# Patient Record
Sex: Female | Born: 1955 | Race: White | Hispanic: No | Marital: Married | Smoking: Former smoker
Health system: Southern US, Community
[De-identification: ages and names within clinical notes are randomized; demographics above are authoritative.]

## PROBLEM LIST (undated history)

## (undated) DIAGNOSIS — G8929 Other chronic pain: Secondary | ICD-10-CM

## (undated) DIAGNOSIS — R002 Palpitations: Secondary | ICD-10-CM

## (undated) DIAGNOSIS — K2270 Barrett's esophagus without dysplasia: Secondary | ICD-10-CM

## (undated) DIAGNOSIS — A498 Other bacterial infections of unspecified site: Secondary | ICD-10-CM

## (undated) DIAGNOSIS — C801 Malignant (primary) neoplasm, unspecified: Secondary | ICD-10-CM

## (undated) DIAGNOSIS — R51 Headache: Secondary | ICD-10-CM

## (undated) DIAGNOSIS — M1990 Unspecified osteoarthritis, unspecified site: Secondary | ICD-10-CM

## (undated) DIAGNOSIS — K529 Noninfective gastroenteritis and colitis, unspecified: Secondary | ICD-10-CM

## (undated) DIAGNOSIS — K219 Gastro-esophageal reflux disease without esophagitis: Secondary | ICD-10-CM

## (undated) DIAGNOSIS — I10 Essential (primary) hypertension: Secondary | ICD-10-CM

## (undated) DIAGNOSIS — T7840XA Allergy, unspecified, initial encounter: Secondary | ICD-10-CM

## (undated) DIAGNOSIS — S22000A Wedge compression fracture of unspecified thoracic vertebra, initial encounter for closed fracture: Secondary | ICD-10-CM

## (undated) DIAGNOSIS — I701 Atherosclerosis of renal artery: Secondary | ICD-10-CM

## (undated) DIAGNOSIS — M797 Fibromyalgia: Secondary | ICD-10-CM

## (undated) DIAGNOSIS — Z8619 Personal history of other infectious and parasitic diseases: Secondary | ICD-10-CM

## (undated) DIAGNOSIS — R519 Headache, unspecified: Secondary | ICD-10-CM

## (undated) DIAGNOSIS — Z86010 Personal history of colonic polyps: Secondary | ICD-10-CM

## (undated) DIAGNOSIS — M069 Rheumatoid arthritis, unspecified: Secondary | ICD-10-CM

## (undated) DIAGNOSIS — K222 Esophageal obstruction: Secondary | ICD-10-CM

## (undated) DIAGNOSIS — Z8719 Personal history of other diseases of the digestive system: Secondary | ICD-10-CM

## (undated) DIAGNOSIS — E559 Vitamin D deficiency, unspecified: Secondary | ICD-10-CM

## (undated) DIAGNOSIS — M810 Age-related osteoporosis without current pathological fracture: Secondary | ICD-10-CM

## (undated) HISTORY — DX: Atherosclerosis of renal artery: I70.1

## (undated) HISTORY — DX: Allergy, unspecified, initial encounter: T78.40XA

## (undated) HISTORY — DX: Age-related osteoporosis without current pathological fracture: M81.0

## (undated) HISTORY — PX: ESOPHAGOGASTRODUODENOSCOPY: SHX1529

## (undated) HISTORY — DX: Other bacterial infections of unspecified site: A49.8

## (undated) HISTORY — DX: Rheumatoid arthritis, unspecified: M06.9

## (undated) HISTORY — DX: Gastro-esophageal reflux disease without esophagitis: K21.9

## (undated) HISTORY — DX: Noninfective gastroenteritis and colitis, unspecified: K52.9

## (undated) HISTORY — DX: Vitamin D deficiency, unspecified: E55.9

## (undated) HISTORY — DX: Esophageal obstruction: K22.2

## (undated) HISTORY — PX: BRACHIOCEPHALIC VEIN ANGIOPLASTY / STENTING: SHX1263

## (undated) HISTORY — PX: COLONOSCOPY: SHX174

## (undated) HISTORY — DX: Headache: R51

## (undated) HISTORY — DX: Headache, unspecified: R51.9

## (undated) HISTORY — DX: Wedge compression fracture of unspecified thoracic vertebra, initial encounter for closed fracture: S22.000A

## (undated) HISTORY — DX: Unspecified osteoarthritis, unspecified site: M19.90

## (undated) HISTORY — DX: Other chronic pain: G89.29

---
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
## 1987-01-16 HISTORY — PX: ABDOMINAL HYSTERECTOMY: SHX81

## 1995-01-16 HISTORY — PX: ROTATOR CUFF REPAIR: SHX139

## 2011-01-16 HISTORY — PX: CARDIAC CATHETERIZATION: SHX172

## 2012-02-08 ENCOUNTER — Encounter (HOSPITAL_COMMUNITY): Payer: Self-pay | Admitting: Pharmacy Technician

## 2012-02-08 ENCOUNTER — Other Ambulatory Visit: Payer: Self-pay | Admitting: Neurosurgery

## 2012-02-11 ENCOUNTER — Encounter (HOSPITAL_COMMUNITY): Payer: Self-pay

## 2012-02-11 ENCOUNTER — Encounter (HOSPITAL_COMMUNITY): Payer: Self-pay | Admitting: Vascular Surgery

## 2012-02-11 ENCOUNTER — Encounter (HOSPITAL_COMMUNITY)
Admission: RE | Admit: 2012-02-11 | Discharge: 2012-02-11 | Disposition: A | Discharge status: Home / Self Care | Payer: 59 | Source: Ambulatory Visit | Attending: Neurosurgery | Admitting: Neurosurgery

## 2012-02-11 HISTORY — DX: Barrett's esophagus without dysplasia: K22.70

## 2012-02-11 HISTORY — DX: Palpitations: R00.2

## 2012-02-11 HISTORY — DX: Fibromyalgia: M79.7

## 2012-02-11 HISTORY — DX: Personal history of other diseases of the digestive system: Z87.19

## 2012-02-11 HISTORY — DX: Essential (primary) hypertension: I10

## 2012-02-11 LAB — CBC
Hemoglobin: 13.9 g/dL (ref 12.0–15.0)
MCH: 29.9 pg (ref 26.0–34.0)
MCHC: 34.3 g/dL (ref 30.0–36.0)
Platelets: 196 10*3/uL (ref 150–400)
RDW: 13.3 % (ref 11.5–15.5)

## 2012-02-11 LAB — BASIC METABOLIC PANEL
BUN: 11 mg/dL (ref 6–23)
Calcium: 9.7 mg/dL (ref 8.4–10.5)
Creatinine, Ser: 0.88 mg/dL (ref 0.50–1.10)
GFR calc Af Amer: 84 mL/min — ABNORMAL LOW (ref >90)
GFR calc non Af Amer: 72 mL/min — ABNORMAL LOW (ref >90)
Potassium: 4.1 mEq/L (ref 3.5–5.1)

## 2012-02-11 LAB — SURGICAL PCR SCREEN
MRSA, PCR: NEGATIVE
Staphylococcus aureus: POSITIVE — AB

## 2012-02-11 MED ORDER — VANCOMYCIN HCL IN DEXTROSE 1-5 GM/200ML-% IV SOLN
1000.0000 mg | Freq: Once | INTRAVENOUS | Status: AC
  Administered 2012-02-12: 1000 mg via INTRAVENOUS
  Filled 2012-02-11: qty 200

## 2012-02-11 NOTE — H&P (Signed)
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 She fell and tripped over a cat on 01/13/2012 and she has had nausea and severe radiating pain around her ribcage.  She says the nausea was severe for the first ten days. She currently describes back and upper abdominal pain at 9/10 and up to 10/10 at times.  She has been taking Flexeril 10 mg. two po q.h.s., Tramadol 50 mg. up to two three times daily, and Cymbalta 30 mg. daily for fibromyalgia without any relief of her significant back pain.    REVIEW OF SYSTEMS:   A detailed Review of Systems sheet was reviewed with the patient.  Pertinent positives include under cardiovascular - she notes leg pain with walking, under respiratory - she notes shortness of breath, under gastrointestinal - she notes indigestion or pain with eating, nausea, and abdominal pain, under musculoskeletal - she notes broken bones and bak pain.  All other systems are negative; this includes Constitutional symptoms, Eyes, Ears, nose, mouth, throat, Endocrine, Genitourinary, Integumentary & Breast, Neurologic, Psychiatric, Hematologic/Lymphatic, Allergic/Immunologic.    PAST MEDICAL HISTORY:      Current Medical Conditions:    Hypertension, GERD with Barrett's esophagus, cervical cancer, and fibromyalgia.      Prior Operations and Hospitalizations:   Significant for hysterectomy in 1989, rotator cuff surgery in 1998.     Medications and Allergies:  Her current medications are Toprol XL 100 mg. b.i.d., Hydrochlorothiazide 25 mg. qd, Dexilant 60 mg. qd, Cymbalta 30 mg. qd, Topamax 50 mg. two q.h.s., Zolpidem 5-10 mg. prn, Cyclobenzaprine 10 mg. q.h.s., Lovaza, CoQ10 qd, Aspirin 325 mg. qd, and Tramadol 50 mg. one to two po q4-6h.  ALLERGIES ARE TO CODEINE AND PENICILLIN.      Height and Weight:     She is  5'3" tall, 133 lbs.  She has a BMI of 23.5.    FAMILY HISTORY:    Her mother is 74 in very good health. Her father is deceased.  There is a family history of Parkinson's disease.    SOCIAL HISTORY:    She denies tobacco, alcohol or drug use.    DIAGNOSTIC STUDIES:   I do not have her recent bone density test, but she states that she had this done and that it was consistent with osteoporosis and osteopenia.    She had an MRI of her thoracic spine which was obtained through [facility] which shows acute to subacute compression fracture of the T9 vertebral body with 60% height loss and less than, I believe it is a typo, but it says less than 5 cm. of retropulsion along the inferior aspect per the report, but in fact this is 5 mm.  There is no evidence of acute angulation or canal compromise or cord compression.    Radiographs were obtained in the office today which show approximately 70% vertebral height loss at the T9 vertebra.  No significant kyphotic angulation or evidence of fracture at other levels.    PHYSICAL EXAMINATION:      General Appearance:   On examination today, Breanna Barr is a pleasant and cooperative woman in no acute distress.      Blood Pressure, Pulse:     Her blood pressure is 130/82.  Heart rate is 74 and regular.  Respiratory rate is  18.      HEENT - normocephalic, atraumatic.  The pupils are equal, round and reactive to light.  The extraocular muscles are intact.  Sclerae - white.  Conjunctiva - pink.  Oropharynx benign.  Uvula midline.     Neck - there are no masses, meningismus, deformities, tracheal deviation, jugular vein distention or carotid bruits.  There is normal cervical range of motion.  Spurlings' test is negative without reproducible radicular pain turning the patient's head to either side.  Lhermitte's sign is not present with axial compression.      Respiratory - there is normal respiratory effort with good intercostal function.  Lungs are clear  to auscultation.  There are no rales, rhonchi or wheezes.      Cardiovascular - the heart has regular rate and rhythm to auscultation.  No murmurs are appreciated.  There is no extremity edema, cyanosis or clubbing.  There are palpable pedal pulses.      Abdomen - soft, nontender, no hepatosplenomegaly appreciated or masses.  There are active bowel sounds.  No guarding or rebound.      Musculoskeletal Examination - she has pain to palpation just below the bra line at approximately the level of T9 and also some paravertebral discomfort to palpation.  She has difficulty getting up from a seated position and has significant pain when she tries to move about the examining room.      NEUROLOGICAL EXAMINATION: The patient is oriented to time, person and place and has good recall of both recent and remote memory with normal attention span and concentration.  The patient speaks with clear and fluent speech and exhibits normal language function and appropriate fund of knowledge.      Cranial Nerve Examination - pupils are equal, round and reactive to light.  Extraocular movements are full.  Visual fields are full to confrontational testing.  Facial sensation and facial movement are symmetric and intact.  Hearing is intact to finger rub.  Palate is upgoing.  Shoulder shrug is symmetric.  Tongue protrudes in the midline.      Motor Examination - motor strength is 5/5 in the bilateral deltoids, biceps, triceps, handgrips, wrist extensors, interosseous.  In the lower extremities motor strength is 5/5 in hip flexion, extension, quadriceps, hamstrings, plantar flexion, dorsiflexion and extensor hallucis longus.      Sensory Examination - normal to light touch and pinprick sensation in the upper and lower extremities.  She does have some T9-T10 hypalgesia to pinprick which is mild and I believe is the basis for her radiating abdominal pain.      Deep Tendon Reflexes - 2 in the biceps, triceps, and brachioradialis, 2  in the knees, 2 in the ankles.  The great toes are downgoing to plantar stimulation.      Cerebellar Examination - normal coordination in upper and lower extremities and normal rapid alternating movements.  Romberg test is negative.    IMPRESSION AND RECOMMENDATIONS: Breanna Barr is a 57 year old woman with a severely painful T9 compression for which she has tried approximately a month of conservative management and feels it is not improving in terms of severity of her pain and limitations of her activities of daily living and ability to function.  Because of this, I have recommended we proceed with T9 kyphoplasty procedure.  She wishes to do so. I went over the specifics of the surgery and went over models, and answered her and her husband's questions.  They wish to proceed and we plan on  doing so in the near future. She knows about needing to be on bone replacement therapy for her osteoporosis.  She will get me the bone density report for my records.     [facility]    Danae Orleans. Venetia Maxon, M.D.  JDS:aft cc: Dr. Laney Potash   Dr. Concepcion Living, [address], [place], [place]  [address]

## 2012-02-11 NOTE — Consult Note (Signed)
Anesthesia Chart Review:  Patient is a 57 year old female scheduled for T9 kyphoplasty on 02/12/12 by Dr. Venetia Maxon.  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  She also reported possible TIA symptoms with hospitalization in 2011 with reported negative work-up.  She has seen cardiologist Dr. Teofilo Pod at [facility] in the past.  Records from his office are pending, but some records were received from [facility] (see below).  Cardiac cath on 06/07/11 [facility]) showed mild non-obstructive CAD (pLAD 20-30%, ostial LCx 20-30%, mCx 20-30%), normal LV wall motion and function, EF 60-65%, normal PA pressure, mild PVD of the right iliac and right common femoral artery.  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 EF 60-65%.  Mild diastolic dysfunction.  Mild MR, trace TR.  EKG and CXR re-requested from [facility], but are still pending.  Notes indicate that an EKG from 01/2011 showed NSR, left BBB, possible LAE.  If no results within the past year, then she will need them done on the day of surgery.  Pre-operative labs noted.  No additional records were received by 1700 today, so any additional records will have to be reviewed by her assigned anesthesiologist tomorrow.  She had only mild CAD by cath last year, so would anticipate she could proceed as planned.  Shonna Chock, PA-C 02/11/12 1700

## 2012-02-11 NOTE — Progress Notes (Signed)
Cardiologist: Dr. Teofilo Pod at [facility] [phone]. Will request ekg,stress test,echo,cath report,cxr and office notes. Pt. States these test were done summer 2013 due to sob and chest pain. States test were negative and symptoms were from hiatal hernia and acid reflux.  Pt. Reported [id number] she had TIA., occipital small hemorrhage, and started aspirin.Never went back to this doctor. Pt. States she doesn't remember his name and that doctor no longer practices in [place]. Pt. Reports 2 yrs. Ago,she had dizziness at the beauty shop and fell back into the chair. Went to [facility] for increase blood pressure and TIA. States she followed up with Kalman Jewels[facility] [place]. Will request notes .

## 2012-02-11 NOTE — Pre-Procedure Instructions (Signed)
Breanna Barr  02/11/2012   Your procedure is scheduled on:  Tuesday, Jan. 28,2014  Report to [facility] at 11:30 AM.  Call this number if you have problems the morning of surgery: [phone]   Remember:   Do not eat food or drink liquids after midnight.   Take these medicines the morning of surgery with A SIP OF WATER: flexeril, cymbalta, metoprolol, topamax, deilant   Do not wear jewelry, make-up or nail polish.  Do not wear lotions, powders, or perfumes. You may wear deodorant.  Do not shave 48 hours prior to surgery. Men may shave face and neck.  Do not bring valuables to the hospital.  Contacts, dentures or bridgework may not be worn into surgery.  Leave suitcase in the car. After surgery it may be brought to your room.  For patients admitted to the hospital, checkout time is 11:00 AM the day of  discharge.   Patients discharged the day of surgery will not be allowed to drive  home.    Special Instructions: Shower using CHG 2 nights before surgery and the night before surgery.  If you shower the day of surgery use CHG.  Use special wash - you have one bottle of CHG for all showers.  You should use approximately 1/3 of the bottle for each shower.   Please read over the following fact sheets that you were given: Pain Booklet, Coughing and Deep Breathing and MRSA Information

## 2012-02-12 ENCOUNTER — Ambulatory Visit (HOSPITAL_COMMUNITY): Payer: 59 | Admitting: Vascular Surgery

## 2012-02-12 ENCOUNTER — Encounter (HOSPITAL_COMMUNITY): Payer: Self-pay | Admitting: Vascular Surgery

## 2012-02-12 ENCOUNTER — Observation Stay (HOSPITAL_COMMUNITY): Payer: 59

## 2012-02-12 ENCOUNTER — Encounter (HOSPITAL_COMMUNITY): Payer: Self-pay | Admitting: *Deleted

## 2012-02-12 ENCOUNTER — Observation Stay (HOSPITAL_COMMUNITY)
Admission: RE | Admit: 2012-02-12 | Discharge: 2012-02-12 | Disposition: A | Discharge status: Home / Self Care | Payer: 59 | Source: Ambulatory Visit | Attending: Neurosurgery | Admitting: Neurosurgery

## 2012-02-12 ENCOUNTER — Encounter (HOSPITAL_COMMUNITY)
Admission: RE | Disposition: A | Discharge status: Home / Self Care | Payer: Self-pay | Source: Ambulatory Visit | Attending: Neurosurgery

## 2012-02-12 DIAGNOSIS — M810 Age-related osteoporosis without current pathological fracture: Secondary | ICD-10-CM | POA: Insufficient documentation

## 2012-02-12 DIAGNOSIS — I10 Essential (primary) hypertension: Secondary | ICD-10-CM | POA: Insufficient documentation

## 2012-02-12 DIAGNOSIS — J449 Chronic obstructive pulmonary disease, unspecified: Secondary | ICD-10-CM | POA: Insufficient documentation

## 2012-02-12 DIAGNOSIS — IMO0001 Reserved for inherently not codable concepts without codable children: Secondary | ICD-10-CM | POA: Insufficient documentation

## 2012-02-12 DIAGNOSIS — K2270 Barrett's esophagus without dysplasia: Secondary | ICD-10-CM | POA: Insufficient documentation

## 2012-02-12 DIAGNOSIS — J4489 Other specified chronic obstructive pulmonary disease: Secondary | ICD-10-CM | POA: Insufficient documentation

## 2012-02-12 DIAGNOSIS — M8448XA Pathological fracture, other site, initial encounter for fracture: Principal | ICD-10-CM | POA: Insufficient documentation

## 2012-02-12 DIAGNOSIS — Z79899 Other long term (current) drug therapy: Secondary | ICD-10-CM | POA: Insufficient documentation

## 2012-02-12 DIAGNOSIS — Z01812 Encounter for preprocedural laboratory examination: Secondary | ICD-10-CM | POA: Insufficient documentation

## 2012-02-12 HISTORY — PX: KYPHOPLASTY: SHX5884

## 2012-02-12 SURGERY — KYPHOPLASTY
Anesthesia: General | Site: Back | Wound class: Clean

## 2012-02-12 MED ORDER — EPHEDRINE SULFATE 50 MG/ML IJ SOLN
INTRAMUSCULAR | Status: DC | PRN
  Administered 2012-02-12: 10 mg via INTRAVENOUS

## 2012-02-12 MED ORDER — SODIUM CHLORIDE 0.9 % IJ SOLN: 3.0000 mL | Freq: Two times a day (BID) | INTRAMUSCULAR | Status: DC

## 2012-02-12 MED ORDER — DEXAMETHASONE SODIUM PHOSPHATE 4 MG/ML IJ SOLN
INTRAMUSCULAR | Status: DC | PRN
  Administered 2012-02-12: 4 mg via INTRAVENOUS

## 2012-02-12 MED ORDER — PANTOPRAZOLE SODIUM 40 MG PO TBEC: 40.0000 mg | DELAYED_RELEASE_TABLET | Freq: Every day | ORAL | Status: DC

## 2012-02-12 MED ORDER — FLEET ENEMA 7-19 GM/118ML RE ENEM: 1.0000 | ENEMA | Freq: Once | RECTAL | Status: DC | PRN

## 2012-02-12 MED ORDER — ACETAMINOPHEN 325 MG PO TABS: 650.0000 mg | ORAL_TABLET | ORAL | Status: DC | PRN

## 2012-02-12 MED ORDER — ONDANSETRON HCL 4 MG/2ML IJ SOLN
4.0000 mg | INTRAMUSCULAR | Status: DC | PRN
  Administered 2012-02-12: 4 mg via INTRAVENOUS
  Filled 2012-02-12: qty 2

## 2012-02-12 MED ORDER — OMEGA-3-ACID ETHYL ESTERS 1 G PO CAPS
1.0000 g | ORAL_CAPSULE | Freq: Two times a day (BID) | ORAL | Status: DC
  Administered 2012-02-12: 1 g via ORAL
  Filled 2012-02-12: qty 1

## 2012-02-12 MED ORDER — LIDOCAINE HCL 1 % IJ SOLN
INTRAMUSCULAR | Status: DC | PRN
  Administered 2012-02-12: 60 mg via INTRADERMAL

## 2012-02-12 MED ORDER — DOCUSATE SODIUM 100 MG PO CAPS
100.0000 mg | ORAL_CAPSULE | Freq: Two times a day (BID) | ORAL | Status: DC
  Administered 2012-02-12: 100 mg via ORAL
  Filled 2012-02-12: qty 1

## 2012-02-12 MED ORDER — HYDROCODONE-ACETAMINOPHEN 5-325 MG PO TABS: 1.0000 | ORAL_TABLET | ORAL | Status: DC | PRN

## 2012-02-12 MED ORDER — DULOXETINE HCL 30 MG PO CPEP
30.0000 mg | ORAL_CAPSULE | Freq: Every day | ORAL | Status: DC
  Filled 2012-02-12 (×2): qty 1

## 2012-02-12 MED ORDER — CYCLOBENZAPRINE HCL 10 MG PO TABS: 20.0000 mg | ORAL_TABLET | Freq: Every day | ORAL | Status: DC

## 2012-02-12 MED ORDER — PROPOFOL 10 MG/ML IV BOLUS
INTRAVENOUS | Status: DC | PRN
  Administered 2012-02-12: 160 mg via INTRAVENOUS

## 2012-02-12 MED ORDER — SODIUM CHLORIDE 0.9 % IJ SOLN: 3.0000 mL | INTRAMUSCULAR | Status: DC | PRN

## 2012-02-12 MED ORDER — KCL IN DEXTROSE-NACL 20-5-0.45 MEQ/L-%-% IV SOLN
INTRAVENOUS | Status: DC
  Filled 2012-02-12: qty 1000

## 2012-02-12 MED ORDER — MENTHOL 3 MG MT LOZG: 1.0000 | LOZENGE | OROMUCOSAL | Status: DC | PRN

## 2012-02-12 MED ORDER — OXYCODONE-ACETAMINOPHEN 5-325 MG PO TABS
1.0000 | ORAL_TABLET | ORAL | Status: DC | PRN
  Administered 2012-02-12: 1 via ORAL
  Filled 2012-02-12: qty 1

## 2012-02-12 MED ORDER — ZOLPIDEM TARTRATE 5 MG PO TABS: 5.0000 mg | ORAL_TABLET | Freq: Every evening | ORAL | Status: DC | PRN

## 2012-02-12 MED ORDER — OXYCODONE-ACETAMINOPHEN 5-325 MG PO TABS: 1.0000 | ORAL_TABLET | ORAL | Status: DC | PRN

## 2012-02-12 MED ORDER — FENTANYL CITRATE 0.05 MG/ML IJ SOLN
INTRAMUSCULAR | Status: AC
  Filled 2012-02-12: qty 2

## 2012-02-12 MED ORDER — IOHEXOL 300 MG/ML  SOLN
INTRAMUSCULAR | Status: DC | PRN
  Administered 2012-02-12: 50 mL via INTRAVENOUS

## 2012-02-12 MED ORDER — SENNOSIDES-DOCUSATE SODIUM 8.6-50 MG PO TABS
1.0000 | ORAL_TABLET | Freq: Every evening | ORAL | Status: DC | PRN
  Administered 2012-02-12: 1 via ORAL
  Filled 2012-02-12: qty 1

## 2012-02-12 MED ORDER — BUPIVACAINE HCL (PF) 0.5 % IJ SOLN
INTRAMUSCULAR | Status: DC | PRN
  Administered 2012-02-12: 2 mL

## 2012-02-12 MED ORDER — MIDAZOLAM HCL 5 MG/5ML IJ SOLN
INTRAMUSCULAR | Status: DC | PRN
  Administered 2012-02-12: 2 mg via INTRAVENOUS

## 2012-02-12 MED ORDER — TRAMADOL HCL 50 MG PO TABS
50.0000 mg | ORAL_TABLET | Freq: Three times a day (TID) | ORAL | Status: DC
  Filled 2012-02-12: qty 1

## 2012-02-12 MED ORDER — BISACODYL 10 MG RE SUPP: 10.0000 mg | Freq: Every day | RECTAL | Status: DC | PRN

## 2012-02-12 MED ORDER — SODIUM CHLORIDE 0.9 % IV SOLN: 250.0000 mL | INTRAVENOUS | Status: DC

## 2012-02-12 MED ORDER — ONDANSETRON HCL 4 MG/2ML IJ SOLN
INTRAMUSCULAR | Status: DC | PRN
  Administered 2012-02-12: 4 mg via INTRAVENOUS

## 2012-02-12 MED ORDER — METOPROLOL SUCCINATE ER 100 MG PO TB24
100.0000 mg | ORAL_TABLET | Freq: Two times a day (BID) | ORAL | Status: DC
  Administered 2012-02-12: 100 mg via ORAL
  Filled 2012-02-12: qty 1

## 2012-02-12 MED ORDER — FENTANYL CITRATE 0.05 MG/ML IJ SOLN
INTRAMUSCULAR | Status: DC | PRN
  Administered 2012-02-12: 100 ug via INTRAVENOUS

## 2012-02-12 MED ORDER — FENTANYL CITRATE 0.05 MG/ML IJ SOLN
25.0000 ug | INTRAMUSCULAR | Status: DC | PRN
  Administered 2012-02-12 (×2): 25 ug via INTRAVENOUS

## 2012-02-12 MED ORDER — MORPHINE SULFATE 2 MG/ML IJ SOLN: 1.0000 mg | INTRAMUSCULAR | Status: DC | PRN

## 2012-02-12 MED ORDER — LACTATED RINGERS IV SOLN
INTRAVENOUS | Status: DC | PRN
  Administered 2012-02-12: 14:00:00 via INTRAVENOUS

## 2012-02-12 MED ORDER — ACETAMINOPHEN 650 MG RE SUPP: 650.0000 mg | RECTAL | Status: DC | PRN

## 2012-02-12 MED ORDER — ONDANSETRON HCL 4 MG/2ML IJ SOLN: 4.0000 mg | Freq: Four times a day (QID) | INTRAMUSCULAR | Status: DC | PRN

## 2012-02-12 MED ORDER — HYDROCHLOROTHIAZIDE 25 MG PO TABS
25.0000 mg | ORAL_TABLET | Freq: Every day | ORAL | Status: DC
  Filled 2012-02-12 (×2): qty 1

## 2012-02-12 MED ORDER — TOPIRAMATE 100 MG PO TABS
100.0000 mg | ORAL_TABLET | Freq: Every day | ORAL | Status: DC
  Filled 2012-02-12: qty 1

## 2012-02-12 MED ORDER — 0.9 % SODIUM CHLORIDE (POUR BTL) OPTIME
TOPICAL | Status: DC | PRN
  Administered 2012-02-12: 1000 mL

## 2012-02-12 MED ORDER — SUCCINYLCHOLINE CHLORIDE 20 MG/ML IJ SOLN
INTRAMUSCULAR | Status: DC | PRN
  Administered 2012-02-12: 100 mg via INTRAVENOUS

## 2012-02-12 MED ORDER — DIAZEPAM 5 MG PO TABS: 5.0000 mg | ORAL_TABLET | Freq: Four times a day (QID) | ORAL | Status: DC | PRN

## 2012-02-12 MED ORDER — PHENOL 1.4 % MT LIQD: 1.0000 | OROMUCOSAL | Status: DC | PRN

## 2012-02-12 MED ORDER — LIDOCAINE-EPINEPHRINE 1 %-1:100000 IJ SOLN
INTRAMUSCULAR | Status: DC | PRN
  Administered 2012-02-12: 2 mL

## 2012-02-12 MED ORDER — PHENYLEPHRINE HCL 10 MG/ML IJ SOLN
INTRAMUSCULAR | Status: DC | PRN
  Administered 2012-02-12: 120 ug via INTRAVENOUS
  Administered 2012-02-12: 80 ug via INTRAVENOUS
  Administered 2012-02-12: 120 ug via INTRAVENOUS
  Administered 2012-02-12: 80 ug via INTRAVENOUS

## 2012-02-12 MED ORDER — SENNA 8.6 MG PO TABS
1.0000 | ORAL_TABLET | Freq: Two times a day (BID) | ORAL | Status: DC
  Administered 2012-02-12: 8.6 mg via ORAL

## 2012-02-12 MED ORDER — ALUM & MAG HYDROXIDE-SIMETH 200-200-20 MG/5ML PO SUSP: 30.0000 mL | Freq: Four times a day (QID) | ORAL | Status: DC | PRN

## 2012-02-12 SURGICAL SUPPLY — 42 items
BLADE SURG 15 STRL LF DISP TIS (BLADE) ×1 IMPLANT
BLADE SURG 15 STRL SS (BLADE) ×1
BLADE SURG ROTATE 9660 (MISCELLANEOUS) IMPLANT
CEMENT BONE KYPHX HV R (Orthopedic Implant) ×2 IMPLANT
CLOTH BEACON ORANGE TIMEOUT ST (SAFETY) ×2 IMPLANT
CONT SPEC 4OZ CLIKSEAL STRL BL (MISCELLANEOUS) ×2 IMPLANT
DERMABOND ADVANCED (GAUZE/BANDAGES/DRESSINGS)
DERMABOND ADVANCED .7 DNX12 (GAUZE/BANDAGES/DRESSINGS) IMPLANT
DRAPE C-ARM 42X72 X-RAY (DRAPES) ×2 IMPLANT
DRAPE LAPAROTOMY 100X72X124 (DRAPES) ×2 IMPLANT
DRAPE PROXIMA HALF (DRAPES) IMPLANT
DRAPE SURG 17X23 STRL (DRAPES) ×2 IMPLANT
DRESSING TELFA 8X3 (GAUZE/BANDAGES/DRESSINGS) IMPLANT
DURAPREP 26ML APPLICATOR (WOUND CARE) ×2 IMPLANT
GAUZE SPONGE 4X4 16PLY XRAY LF (GAUZE/BANDAGES/DRESSINGS) ×2 IMPLANT
GLOVE BIO SURGEON STRL SZ8 (GLOVE) ×2 IMPLANT
GLOVE BIOGEL PI IND STRL 7.0 (GLOVE) ×2 IMPLANT
GLOVE BIOGEL PI IND STRL 8.5 (GLOVE) ×1 IMPLANT
GLOVE BIOGEL PI INDICATOR 7.0 (GLOVE) ×2
GLOVE BIOGEL PI INDICATOR 8.5 (GLOVE) ×1
GLOVE EXAM NITRILE LRG STRL (GLOVE) IMPLANT
GLOVE EXAM NITRILE MD LF STRL (GLOVE) IMPLANT
GLOVE EXAM NITRILE XL STR (GLOVE) IMPLANT
GLOVE EXAM NITRILE XS STR PU (GLOVE) IMPLANT
GLOVE SURG SS PI 7.0 STRL IVOR (GLOVE) ×4 IMPLANT
GLOVE SURG SS PI 7.5 STRL IVOR (GLOVE) ×2 IMPLANT
GOWN BRE IMP SLV AUR LG STRL (GOWN DISPOSABLE) IMPLANT
GOWN BRE IMP SLV AUR XL STRL (GOWN DISPOSABLE) ×6 IMPLANT
GOWN STRL REIN 2XL LVL4 (GOWN DISPOSABLE) IMPLANT
KIT BASIN OR (CUSTOM PROCEDURE TRAY) ×2 IMPLANT
KIT ROOM TURNOVER OR (KITS) ×2 IMPLANT
NEEDLE HYPO 25X1 1.5 SAFETY (NEEDLE) ×2 IMPLANT
NS IRRIG 1000ML POUR BTL (IV SOLUTION) ×2 IMPLANT
PACK SURGICAL SETUP 50X90 (CUSTOM PROCEDURE TRAY) ×2 IMPLANT
PAD ARMBOARD 7.5X6 YLW CONV (MISCELLANEOUS) ×10 IMPLANT
STAPLER SKIN PROX WIDE 3.9 (STAPLE) ×2 IMPLANT
SUT VIC AB 3-0 SH 8-18 (SUTURE) ×2 IMPLANT
SYR CONTROL 10ML LL (SYRINGE) ×2 IMPLANT
TOWEL OR 17X24 6PK STRL BLUE (TOWEL DISPOSABLE) ×2 IMPLANT
TOWEL OR 17X26 10 PK STRL BLUE (TOWEL DISPOSABLE) ×2 IMPLANT
TRAY KYPHOPAK 20/3 ONESTEP 1ST (MISCELLANEOUS) ×2 IMPLANT
TRAY KYPHOPAK 20/3 ONESTEP CDS (KITS) ×2 IMPLANT

## 2012-02-12 NOTE — Transfer of Care (Signed)
Immediate Anesthesia Transfer of Care Note  Patient: Breanna Barr  Procedure(s) Performed: Procedure(s) (LRB) with comments: KYPHOPLASTY (N/A) - Thoracic nine Kyphoplasty  Patient Location: PACU  Anesthesia Type:General  Level of Consciousness: awake and patient cooperative  Airway & Oxygen Therapy: Patient Spontanous Breathing and Patient connected to nasal cannula oxygen  Post-op Assessment: Report given to PACU RN and Post -op Vital signs reviewed and stable  Post vital signs: Reviewed and stable  Complications: No apparent anesthesia complications

## 2012-02-12 NOTE — Anesthesia Preprocedure Evaluation (Addendum)
Anesthesia Evaluation  Patient identified by MRN, date of birth, ID band Patient awake    Reviewed: Allergy & Precautions, H&P , NPO status , Patient's Chart, lab work & pertinent test results  Airway Mallampati: II  Neck ROM: full    Dental   Pulmonary COPDformer smoker,          Cardiovascular hypertension, Pt. on medications and Pt. 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 EF 60-65%.  Mild diastolic dysfunction.  Mild MR, trace TR.  EKG and CXR re-requested from [facility], but are still pending.  Notes indicate that an EKG from 01/2011 showed NSR, left BBB, possible LAE.   Neuro/Psych  Neuromuscular disease (pathalogical spine fx)    GI/Hepatic Neg liver ROS, hiatal hernia, Barrett's esophagus    Endo/Other  negative endocrine ROS  Renal/GU negative Renal ROS     Musculoskeletal  (+) Arthritis -, Osteoarthritis,  Fibromyalgia -  Abdominal   Peds  Hematology negative hematology ROS (+)   Anesthesia Other Findings   Reproductive/Obstetrics                       Anesthesia Physical Anesthesia Plan  ASA: II  Anesthesia Plan: General   Post-op Pain Management:    Induction: Intravenous  Airway Management Planned: Oral ETT  Additional Equipment:   Intra-op Plan:   Post-operative Plan: Extubation in OR  Informed Consent: I have reviewed the patients History and Physical, chart, labs and discussed the procedure including the risks, benefits and  alternatives for the proposed anesthesia with the patient or authorized representative who has indicated his/her understanding and acceptance.     Plan Discussed with: CRNA and Surgeon  Anesthesia Plan Comments:         Anesthesia Quick Evaluation

## 2012-02-12 NOTE — Discharge Summary (Signed)
  Physician Discharge Summary  Patient ID: Breanna Barr MRN: [id number] DOB/AGE: [birth year] 57 y.o.  Admit date: 02/12/2012 Discharge date: 02/12/2012  Admission Diagnoses:T 9 compression fracture with osteoporosis  Discharge Diagnoses: T 9 compression fracture with osteoporosis Active Problems:  * No active hospital problems. *    Discharged Condition: good  Hospital Course: Uncomplicated T 9 kyphoplasty  Consults: None  Significant Diagnostic Studies: None  Treatments: surgery: Uncomplicated T 9 kyphoplasty  Discharge Exam: Blood pressure 149/77, pulse 78, temperature 97.8 F (36.6 C), temperature source Oral, resp. rate 18, SpO2 100.00%. Neurologic: Alert and oriented X 3, normal strength and tone. Normal symmetric reflexes. Normal coordination and gait Wound:CDI  Disposition: Home     Medication List     As of 02/12/2012  4:56 PM    TAKE these medications         aspirin 325 MG EC tablet   Take 325 mg by mouth daily.      COQ-10 PO   Take 1 tablet by mouth 2 (two) times daily.      cyclobenzaprine 10 MG tablet   Commonly known as: FLEXERIL   Take 20 mg by mouth at bedtime.      dexlansoprazole 60 MG capsule   Commonly known as: DEXILANT   Take 60 mg by mouth daily.      DULoxetine 30 MG capsule   Commonly known as: CYMBALTA   Take 30 mg by mouth daily.      hydrochlorothiazide 25 MG tablet   Commonly known as: HYDRODIURIL   Take 25 mg by mouth daily.      metoprolol succinate 100 MG 24 hr tablet   Commonly known as: TOPROL-XL   Take 100 mg by mouth 2 (two) times daily. Take with or immediately following a meal.      omega-3 acid ethyl esters 1 G capsule   Commonly known as: LOVAZA   Take 1 g by mouth 2 (two) times daily.      oxyCODONE-acetaminophen 5-325 MG per tablet   Commonly known as: PERCOCET/ROXICET   Take 1-2 tablets by mouth every 4 (four) hours as needed.      topiramate 50 MG tablet   Commonly known as: TOPAMAX   Take 100 mg by  mouth at bedtime.      traMADol 50 MG tablet   Commonly known as: ULTRAM   Take 50 mg by mouth 3 (three) times daily.      zolpidem 10 MG tablet   Commonly known as: AMBIEN   Take 5 mg by mouth at bedtime as needed. For sleep         Signed: Dorian Heckle, MD 02/12/2012, 4:56 PM

## 2012-02-12 NOTE — Brief Op Note (Signed)
02/12/2012  4:23 PM  PATIENT:  Breanna Barr  57 y.o. female  PRE-OPERATIVE DIAGNOSIS:  Pathologic fracture of thoracic nine vertebra, Osteoporosis   POST-OPERATIVE DIAGNOSIS:  Pathologic fracture of thoracic nine vertebra, Osteoporosis   PROCEDURE:  Procedure(s) (LRB) with comments: KYPHOPLASTY (N/A) - Thoracic nine Kyphoplasty  SURGEON:  Surgeon(s) and Role:    * Maeola Harman, MD - Primary  PHYSICIAN ASSISTANT:   ASSISTANTS: none   ANESTHESIA:   general  EBL:     BLOOD ADMINISTERED:none  DRAINS: none   LOCAL MEDICATIONS USED:  LIDOCAINE   SPECIMEN:  No Specimen  DISPOSITION OF SPECIMEN:  N/A  COUNTS:  YES  TOURNIQUET:  * No tourniquets in log *  DICTATION: DICTATION: Patient is  57 year old woman with osteoporosis and thoracic nine compression fracture.  There was significant flattening of vertebral body significant back pain.  It was elected to take her to surgery for kyphoplasty procedure.  PROCEDURE:  Following the smooth and uncomplicated induction of general endotracheal anesthesia, the patient was placed in a prone position on chest rolls.  C-arm fluoroscopy was positioned in both the AP and lateral planes, centered on the T 9 vertebra.  Her back was prepped and draped in the usual sterile fashion with betadine scrub and Duraprep.  Using a right uni-pedicular approach, the right T9 pedicle and vertebral body were entered with the trochar using standard landmarks.  The drill was used, followed by a 15 cc Kyphon balloon, which was used to re-expand the broken vertebra.  Subsequently, 6 cc of bone cement was placed into the void created by the balloon and was seen to fill the fracture cleft and fill the vertebra in both the AP and lateral direction with good interdigitation and with minimal apparent extravasation into the T 8/9 disc space. The bone void filler was then removed.  Final X-ray demonstrated good filling within the fractured vertebra.  The incision was closed  with a single 3-0 vicryl stitch and dressed with Dermabond. The patient was returned to the OR gurney and extubated in the OR and taken to Recovery in stable and satisfactory condition, having tolerated the procedure well.  Counts were correct at the end of the case.   PLAN OF CARE: Admit for overnight observation  PATIENT DISPOSITION:  PACU - hemodynamically stable.   Delay start of Pharmacological VTE agent (>24hrs) due to surgical blood loss or risk of bleeding: yes

## 2012-02-12 NOTE — Progress Notes (Signed)
Awake, alert, conversant.  Minimal pain.   MAEW.  Doing well.

## 2012-02-12 NOTE — Progress Notes (Signed)
Pt d/c'd home with family. Discharge instructions given via teach back method, pt demonstrated understanding. Incision area on back clean and dry with no redness, swelling, drainage and/or any other s/s of infection noted. Pt's pain level upon discharge was 2. Questions answered appropriately. Care notes given.

## 2012-02-12 NOTE — Anesthesia Postprocedure Evaluation (Signed)
  Anesthesia Post-op Note  Patient: Breanna Barr  Procedure(s) Performed: Procedure(s) (LRB) with comments: KYPHOPLASTY (N/A) - Thoracic nine Kyphoplasty  Patient Location: PACU  Anesthesia Type:General  Level of Consciousness: awake, alert  and oriented  Airway and Oxygen Therapy: Patient Spontanous Breathing and Patient connected to nasal cannula oxygen  Post-op Pain: mild  Post-op Assessment: Post-op Vital signs reviewed  Post-op Vital Signs: Reviewed  Complications: No apparent anesthesia complications

## 2012-02-12 NOTE — Op Note (Signed)
02/12/2012  4:23 PM  PATIENT:  Breanna Barr  56 y.o. female  PRE-OPERATIVE DIAGNOSIS:  Pathologic fracture of thoracic nine vertebra, Osteoporosis   POST-OPERATIVE DIAGNOSIS:  Pathologic fracture of thoracic nine vertebra, Osteoporosis   PROCEDURE:  Procedure(s) (LRB) with comments: KYPHOPLASTY (N/A) - Thoracic nine Kyphoplasty  SURGEON:  Surgeon(s) and Role:    * Fedra Lanter, MD - Primary  PHYSICIAN ASSISTANT:   ASSISTANTS: none   ANESTHESIA:   general  EBL:     BLOOD ADMINISTERED:none  DRAINS: none   LOCAL MEDICATIONS USED:  LIDOCAINE   SPECIMEN:  No Specimen  DISPOSITION OF SPECIMEN:  N/A  COUNTS:  YES  TOURNIQUET:  * No tourniquets in log *  DICTATION: DICTATION: Patient is  56 year old woman with osteoporosis and thoracic nine compression fracture.  There was significant flattening of vertebral body significant back pain.  It was elected to take her to surgery for kyphoplasty procedure.  PROCEDURE:  Following the smooth and uncomplicated induction of general endotracheal anesthesia, the patient was placed in a prone position on chest rolls.  C-arm fluoroscopy was positioned in both the AP and lateral planes, centered on the T 9 vertebra.  Her back was prepped and draped in the usual sterile fashion with betadine scrub and Duraprep.  Using a right uni-pedicular approach, the right T9 pedicle and vertebral body were entered with the trochar using standard landmarks.  The drill was used, followed by a 15 cc Kyphon balloon, which was used to re-expand the broken vertebra.  Subsequently, 6 cc of bone cement was placed into the void created by the balloon and was seen to fill the fracture cleft and fill the vertebra in both the AP and lateral direction with good interdigitation and with minimal apparent extravasation into the T 8/9 disc space. The bone void filler was then removed.  Final X-ray demonstrated good filling within the fractured vertebra.  The incision was closed  with a single 3-0 vicryl stitch and dressed with Dermabond. The patient was returned to the OR gurney and extubated in the OR and taken to Recovery in stable and satisfactory condition, having tolerated the procedure well.  Counts were correct at the end of the case.   PLAN OF CARE: Admit for overnight observation  PATIENT DISPOSITION:  PACU - hemodynamically stable.   Delay start of Pharmacological VTE agent (>24hrs) due to surgical blood loss or risk of bleeding: yes  

## 2012-02-12 NOTE — Preoperative (Signed)
Beta Blockers   Reason not to administer Beta Blockers:Metoprolol 02/12/12 0815

## 2012-02-12 NOTE — Interval H&P Note (Signed)
History and Physical Interval Note:  02/12/2012 6:37 AM  Breanna Barr  has presented today for surgery, with the diagnosis of Pathologic fracture, Osteoporosis  The various methods of treatment have been discussed with the patient and family. After consideration of risks, benefits and other options for treatment, the patient has consented to  Procedure(s) (LRB) with comments: KYPHOPLASTY (N/A) - T9 Kyphoplasty as a surgical intervention .  The patient's history has been reviewed, patient examined, no change in status, stable for surgery.  I have reviewed the patient's chart and labs.  Questions were answered to the patient's satisfaction.     [name]  Date of Initial H&P: 02/11/2012  History reviewed, patient examined, no change in status, stable for surgery.

## 2012-02-13 ENCOUNTER — Encounter (HOSPITAL_COMMUNITY): Payer: Self-pay | Admitting: Neurosurgery

## 2014-02-15 ENCOUNTER — Telehealth: Payer: Self-pay | Admitting: Internal Medicine

## 2014-02-15 NOTE — Telephone Encounter (Signed)
Patient called last evening. Complaining of persisting headache since colonoscopy last week. Denies any issues related to the procedure directly including abdominal pain or rectal bleeding. Polyp removed with cold biopsy. Hemorrhoids-felt to be a candidate for hemorrhoidal banding.  Patient states she takes Botox regularly  for migraines and got her injection just before the colonoscopy and was 2 weeks late getting it. I told her do not feel that she had any complications related to the colonoscopy and she would be best advised to contact her migraine doctor. We will see that she has a follow-up appointment in the works for 3 weeks from now for hemorrhoid banding.

## 2014-02-15 NOTE — Telephone Encounter (Signed)
Breanna Barr, please schedule banding.

## 2014-02-17 NOTE — Telephone Encounter (Signed)
This encounter was created in error - please disregard.

## 2014-02-17 NOTE — Telephone Encounter (Signed)
Wrong patient chart. This has been entered into the correct patient chart.

## 2014-02-17 NOTE — Addendum Note (Signed)
Addended by: Claudina Lick on: 02/17/2014 02:30 PM   Modules accepted: Level of Service, SmartSet

## 2014-04-02 ENCOUNTER — Ambulatory Visit: Payer: Self-pay | Admitting: Internal Medicine

## 2014-08-04 ENCOUNTER — Encounter: Payer: Self-pay | Admitting: Internal Medicine

## 2014-09-01 ENCOUNTER — Ambulatory Visit (INDEPENDENT_AMBULATORY_CARE_PROVIDER_SITE_OTHER): Payer: 59 | Admitting: Internal Medicine

## 2014-09-01 ENCOUNTER — Telehealth: Payer: Self-pay | Admitting: Internal Medicine

## 2014-09-01 ENCOUNTER — Encounter: Payer: Self-pay | Admitting: Internal Medicine

## 2014-09-01 VITALS — BP 140/78 | HR 72 | Ht 63.0 in | Wt 144.0 lb

## 2014-09-01 DIAGNOSIS — R1314 Dysphagia, pharyngoesophageal phase: Secondary | ICD-10-CM

## 2014-09-01 DIAGNOSIS — R1310 Dysphagia, unspecified: Secondary | ICD-10-CM

## 2014-09-01 DIAGNOSIS — R1319 Other dysphagia: Secondary | ICD-10-CM

## 2014-09-01 DIAGNOSIS — K2270 Barrett's esophagus without dysplasia: Secondary | ICD-10-CM

## 2014-09-01 DIAGNOSIS — K589 Irritable bowel syndrome without diarrhea: Secondary | ICD-10-CM

## 2014-09-01 NOTE — Patient Instructions (Addendum)
  You have been scheduled for a Barium Esophogram at [facility] on 09/08/14 at 11:30AM. Please arrive 15 minutes prior to your appointment for registration. Make certain not to have anything to eat or drink 3 hours prior to your test. If you need to reschedule for any reason, please contact radiology at [phone] to do so. __________________________________________________________________ A barium swallow is an examination that concentrates on views of the esophagus. This tends to be a double contrast exam (barium and two liquids which, when combined, create a gas to distend the wall of the oesophagus) or single contrast (non-ionic iodine based). The study is usually tailored to your symptoms so a good history is essential. Attention is paid during the study to the form, structure and configuration of the esophagus, looking for functional disorders (such as aspiration, dysphagia, achalasia, motility and reflux) EXAMINATION You may be asked to change into a gown, depending on the type of swallow being performed. A radiologist and radiographer will perform the procedure. The radiologist will advise you of the type of contrast selected for your procedure and direct you during the exam. You will be asked to stand, sit or lie in several different positions and to hold a small amount of fluid in your mouth before being asked to swallow while the imaging is performed .In some instances you may be asked to swallow barium coated marshmallows to assess the motility of a solid food bolus. The exam can be recorded as a digital or video fluoroscopy procedure. POST PROCEDURE It will take 1-2 days for the barium to pass through your system. To facilitate this, it is important, unless otherwise directed, to increase your fluids for the next 24-48hrs and to resume your normal diet.  This test typically takes about 30 minutes to  perform. __________________________________________________________________________________  We have faxed a records release to get your Dr West Carbo records for Dr Carlean Purl to review.   I appreciate the opportunity to care for you. Silvano Rusk, MD, [facility]

## 2014-09-01 NOTE — Telephone Encounter (Signed)
Called and left her a message that no need to bring paper back.

## 2014-09-01 NOTE — Progress Notes (Signed)
Subjective:    Patient ID: Breanna Barr, female    DOB: 18-Mar-1955, 59 y.o.   MRN: [id number] Chief complaint: Dysphagia HPI Patient is a very nice middle-aged married white woman who has a long history of dysphagia having undergone multiple upper endoscopy and dilation procedures over the last 5 years, in [place]. I do not have records but she is a good historian and reports that she had annual upper GI endoscopy and dilations. Last year she had 2 and says it took her gastroenterologist extra time and he said that he wasn't sure he would ever be able to dilated again that she might need a surgical referral because of the stricture. She is maintained on DEXA line with Zantac daily also. She uses baking soda frequently as well for her heartburn and indigestion. She has daily solid and liquid dysphagia and odynophagia symptoms. There has not been any progressive weight loss. She says she carries a diagnosis of Barrett's esophagus as well. Relief with dilation in the past but not this past time. She does regurgitate at night sometimes as well.  Other studies included a barium swallow many years ago. Colonoscopy last in 2010. She has urgent postprandial soft stool defecation. She has chronic left upper quadrant and epigastric pain and it hurts to lie on that side it's more of a soreness or tenderness. She has postprandial nausea at times. She does report that her father had frequent esophageal dilations as well. Allergies  Allergen Reactions  . Codeine Other (See Comments)    Rapid heart rate  . Penicillins Shortness Of Breath and Other (See Comments)    Rapid heart rate   Outpatient Prescriptions Prior to Visit  Medication Sig Dispense Refill  . aspirin 325 MG EC tablet Take 325 mg by mouth daily.    . Coenzyme Q10 (COQ-10 PO) Take 1 tablet by mouth 2 (two) times daily.    . cyclobenzaprine (FLEXERIL) 10 MG tablet Take 20 mg by mouth at bedtime.    Marland Kitchen dexlansoprazole (DEXILANT) 60 MG  capsule Take 60 mg by mouth daily.    . DULoxetine (CYMBALTA) 30 MG capsule Take 30 mg by mouth daily.    . hydrochlorothiazide (HYDRODIURIL) 25 MG tablet Take 25 mg by mouth daily.    . metoprolol succinate (TOPROL-XL) 100 MG 24 hr tablet Take 100 mg by mouth 2 (two) times daily. Take with or immediately following a meal.    . topiramate (TOPAMAX) 50 MG tablet Take 100 mg by mouth at bedtime.    . traMADol (ULTRAM) 50 MG tablet Take 50 mg by mouth 3 (three) times daily.    Marland Kitchen zolpidem (AMBIEN) 10 MG tablet Take 5 mg by mouth at bedtime as needed. For sleep    . omega-3 acid ethyl esters (LOVAZA) 1 G capsule Take 1 g by mouth 2 (two) times daily.    Marland Kitchen oxyCODONE-acetaminophen (PERCOCET/ROXICET) 5-325 MG per tablet Take 1-2 tablets by mouth every 4 (four) hours as needed. 30 tablet 0   No facility-administered medications prior to visit.   Past Medical History  Diagnosis Date  . Hypertension   . Arthritis   . H/O hiatal hernia   . Fibromyalgia   . Barrett's esophagus   . Palpitations   . Osteoporosis   . Vitamin D deficiency   . Compression fracture of thoracic vertebra     T9  . Esophageal stricture   . GERD (gastroesophageal reflux disease)   . Chronic headaches    Past Surgical  History  Procedure Laterality Date  . Abdominal hysterectomy  1989  . Rotator cuff repair Right 1997  . Cardiac catheterization  2013    [facility]) mild non-obstructive CAD, normal LV wall motion and function, EF 60-65%, normal PA pressure, mild PVD of the right iliac and right common femoral artery.  . Kyphoplasty  02/12/2012    Procedure: KYPHOPLASTY;  Surgeon: Erline Levine, MD;  Location: [place] [facility];  Service: Neurosurgery;  Laterality: N/A;  Thoracic nine Kyphoplasty  . Esophagogastroduodenoscopy  Multiple    With esophageal dilation  . Colonoscopy  Last, 2010   Social History   Social History  . Marital Status: Married    Spouse Name: N/A  . Number of Children: N/A  . Years of  Education: N/A   Social History Main Topics  . Smoking status: Former Smoker    Quit date: 02/11/1984  . Smokeless tobacco: None  . Alcohol Use: No  . Drug Use: No  . Sexual Activity: Not Asked   Other Topics Concern  . None   Social History Narrative   The patient is married. She has raised her great niece and great-nephew. One as a teenager the other is an adult. No biologic children. She is a retired Conservation officer, nature.   2-4 caffeinated beverages daily   No alcohol no tobacco. Remote smoking.   Family History  Problem Relation Age of Onset  . Cancer - Cervical Mother   . Stroke Father      Review of Systems Positive for finger pain and arthritis. Insomnia. All other review of systems negative or as above    Objective:   Physical Exam @BP  140/78 mmHg  Pulse 72  Ht 5\' 3"  (1.6 m)  Wt 144 lb (65.318 kg)  BMI 25.51 kg/m2@  General:  Well-developed, well-nourished and in no acute distress Eyes:  anicteric. ENT:   Mouth and posterior pharynx free of lesions.  Neck:   supple w/o thyromegaly or mass.  Lungs: Clear to auscultation bilaterally. Heart:  S1S2, no rubs, murmurs, gallops. Abdomen:  soft and benign but diffusely tender to light touch , no hepatosplenomegaly, hernia, or mass and BS+.  Lymph:  no cervical or supraclavicular adenopathy. Extremities:   no edema, cyanosis or clubbing she does have PIP and DIP nodularity consistent with osteoarthritis and what appear to be Dupuytren's contractures on both palms Skin   no rash. Neuro:  A&O x 3.  Psych:  appropriate mood and  Affect.   Data Reviewed:  I have requested records of previous upper endoscopies and GI notes and colonoscopy reports and any pathology, from her previous gastroenterologist, Dr. West Carbo      Assessment & Plan:   1. Esophageal dysphagia   2. Barrett's esophagus   3. IBS (irritable bowel syndrome)    Based upon the available information I'm not sure if this is a stricture  problem or motility problem or both. I think she does have functional GI problems, IBS seems likely and is very common in conjunction with fibromyalgia which she does have.  First step for evaluation after record review will be a barium swallow, it may come before the record review. We'll administer a tablet. Then the next step could be possibility of esophageal manometry or upper GI endoscopy and dilation. Endoscopy risks reviewed with the patient today and I've explained what an esophageal manometry is. We will contact her after the barium swallow results are in. He will continue her current medications otherwise.  I appreciate the  opportunity to care for this patient.

## 2014-09-08 ENCOUNTER — Ambulatory Visit (HOSPITAL_COMMUNITY)
Admission: RE | Admit: 2014-09-08 | Discharge: 2014-09-08 | Disposition: A | Discharge status: Home / Self Care | Payer: 59 | Source: Ambulatory Visit | Attending: Internal Medicine | Admitting: Internal Medicine

## 2014-09-08 DIAGNOSIS — K219 Gastro-esophageal reflux disease without esophagitis: Secondary | ICD-10-CM | POA: Insufficient documentation

## 2014-09-08 DIAGNOSIS — K449 Diaphragmatic hernia without obstruction or gangrene: Secondary | ICD-10-CM | POA: Diagnosis not present

## 2014-09-08 DIAGNOSIS — R1319 Other dysphagia: Secondary | ICD-10-CM | POA: Diagnosis present

## 2014-09-08 DIAGNOSIS — R1310 Dysphagia, unspecified: Secondary | ICD-10-CM

## 2014-09-08 DIAGNOSIS — K2270 Barrett's esophagus without dysplasia: Secondary | ICD-10-CM | POA: Diagnosis not present

## 2014-09-13 NOTE — Progress Notes (Signed)
Quick Note:  Small hiatal; hernia ? Of abnormal lining in mid esophagus (not suggestive of anything bad like cancer)  I need her to schedule an EGD w/ possible dilation in [place] re: dysphagia and Hx Barrett's ______

## 2014-09-17 ENCOUNTER — Ambulatory Visit (AMBULATORY_SURGERY_CENTER): Payer: Self-pay

## 2014-09-17 VITALS — Ht 62.25 in | Wt 148.0 lb

## 2014-09-17 DIAGNOSIS — R1314 Dysphagia, pharyngoesophageal phase: Secondary | ICD-10-CM

## 2014-09-17 NOTE — Progress Notes (Signed)
No allergies  To eggs or soy No diet/weight loss meds No home oxygen No past problems with anesthesia  Refused emmi

## 2014-09-21 ENCOUNTER — Ambulatory Visit (AMBULATORY_SURGERY_CENTER): Payer: 59 | Admitting: Internal Medicine

## 2014-09-21 ENCOUNTER — Encounter: Payer: Self-pay | Admitting: Internal Medicine

## 2014-09-21 VITALS — BP 150/79 | HR 74 | Temp 98.8°F | Resp 18 | Ht 62.25 in | Wt 148.0 lb

## 2014-09-21 DIAGNOSIS — K2950 Unspecified chronic gastritis without bleeding: Secondary | ICD-10-CM | POA: Diagnosis not present

## 2014-09-21 DIAGNOSIS — K222 Esophageal obstruction: Secondary | ICD-10-CM

## 2014-09-21 DIAGNOSIS — K449 Diaphragmatic hernia without obstruction or gangrene: Secondary | ICD-10-CM | POA: Diagnosis not present

## 2014-09-21 DIAGNOSIS — R1314 Dysphagia, pharyngoesophageal phase: Secondary | ICD-10-CM

## 2014-09-21 DIAGNOSIS — K3189 Other diseases of stomach and duodenum: Secondary | ICD-10-CM | POA: Diagnosis not present

## 2014-09-21 DIAGNOSIS — R1310 Dysphagia, unspecified: Secondary | ICD-10-CM

## 2014-09-21 DIAGNOSIS — R1319 Other dysphagia: Secondary | ICD-10-CM

## 2014-09-21 DIAGNOSIS — K2970 Gastritis, unspecified, without bleeding: Secondary | ICD-10-CM | POA: Diagnosis not present

## 2014-09-21 MED ORDER — SODIUM CHLORIDE 0.9 % IV SOLN: 500.0000 mL | INTRAVENOUS | Status: DC

## 2014-09-21 NOTE — Progress Notes (Signed)
Called to room to assist during endoscopic procedure.  Patient ID and intended procedure confirmed with present staff. Received instructions for my participation in the procedure from the performing physician.  

## 2014-09-21 NOTE — Progress Notes (Signed)
Transferred to recovery room. A/O x3, pleased with MAC.  VSS.  Report to Celia, RN. 

## 2014-09-21 NOTE — Patient Instructions (Addendum)
I dilated the esophagus - hope this helps.  There was a ? Of Barrett's - biopsied - very small area. Gastritis also - biopsied. Naproxen (naprosyn)  Small hiatal hernia.  Will call results and plans.  I appreciate the opportunity to care for you. Gatha Mayer, MD, [facility]   Discharge instructions given. Handouts on gastritis and a dilatation diet. Resume previous medications. YOU HAD AN ENDOSCOPIC PROCEDURE TODAY AT [facility]:   Refer to the procedure report that was given to you for any specific questions about what was found during the examination.  If the procedure report does not answer your questions, please call your gastroenterologist to clarify.  If you requested that your care partner not be given the details of your procedure findings, then the procedure report has been included in a sealed envelope for you to review at your convenience later.  YOU SHOULD EXPECT: Some feelings of bloating in the abdomen. Passage of more gas than usual.  Walking can help get rid of the air that was put into your GI tract during the procedure and reduce the bloating. If you had a lower endoscopy (such as a colonoscopy or flexible sigmoidoscopy) you may notice spotting of blood in your stool or on the toilet paper. If you underwent a bowel prep for your procedure, you may not have a normal bowel movement for a few days.  Please Note:  You might notice some irritation and congestion in your nose or some drainage.  This is from the oxygen used during your procedure.  There is no need for concern and it should clear up in a day or so.  SYMPTOMS TO REPORT IMMEDIATELY:   Following lower endoscopy (colonoscopy or flexible sigmoidoscopy):  Excessive amounts of blood in the stool  Significant tenderness or worsening of abdominal pains  Swelling of the abdomen that is new, acute  Fever of 100F or higher   Following upper endoscopy (EGD)  Vomiting of blood or coffee ground  material  New chest pain or pain under the shoulder blades  Painful or persistently difficult swallowing  New shortness of breath  Fever of 100F or higher  Black, tarry-looking stools  For urgent or emergent issues, a gastroenterologist can be reached at any hour by calling [phone].   DIET: Your first meal following the procedure should be a small meal and then it is ok to progress to your normal diet. Heavy or fried foods are harder to digest and may make you feel nauseous or bloated.  Likewise, meals heavy in dairy and vegetables can increase bloating.  Drink plenty of fluids but you should avoid alcoholic beverages for 24 hours.  ACTIVITY:  You should plan to take it easy for the rest of today and you should NOT DRIVE or use heavy machinery until tomorrow (because of the sedation medicines used during the test).    FOLLOW UP: Our staff will call the number listed on your records the next business day following your procedure to check on you and address any questions or concerns that you may have regarding the information given to you following your procedure. If we do not reach you, we will leave a message.  However, if you are feeling well and you are not experiencing any problems, there is no need to return our call.  We will assume that you have returned to your regular daily activities without incident.  If any biopsies were taken you will be contacted by phone  or by letter within the next 1-3 weeks.  Please call us at [phone] if you have not heard about the biopsies in 3 weeks.    SIGNATURES/CONFIDENTIALITY: You and/or your care partner have signed paperwork which will be entered into your electronic medical record.  These signatures attest to the fact that that the information above on your After Visit Summary has been reviewed and is understood.  Full responsibility of the confidentiality of this discharge information lies with you and/or your care-partner.

## 2014-09-21 NOTE — Op Note (Addendum)
[place]  [address] [place], [address]   ENDOSCOPY PROCEDURE REPORT  PATIENT: Breanna, Barr  MR#: [id number] BIRTHDATE: [birth year] , 58  yrs. old GENDER: female ENDOSCOPIST: Gatha Mayer, MD, [facility] PROCEDURE DATE:  09/21/2014 PROCEDURE:  EGD w/ biopsy and EGD w/ balloon dilation ASA CLASS:     Class II INDICATIONS:  dysphagia. MEDICATIONS: Propofol 200 mg IV and Monitored anesthesia care TOPICAL ANESTHETIC: none  DESCRIPTION OF PROCEDURE: After the risks benefits and alternatives of the procedure were thoroughly explained, informed consent was obtained.  The LB [id number] endoscope was introduced through the mouth and advanced to the second portion of the duodenum , Without limitations.  The instrument was slowly withdrawn as the mucosa was fully examined.    1) Ring-like stricture at GE junction - dilated 16,17,18 mm balloon, slight heme, good result 2) Two columnar areas distal esophagus, < 1 cm max, biopsied ? Barrett's 3) 3 cm hiatal hernia 4) Gastritis in antrum and fundus - biopsied.  Slight erosions, erythema. 5) Otherwise normal EGD.  Retroflexed views revealed as previously described.     The scope was then withdrawn from the patient and the procedure completed.  COMPLICATIONS: There were no immediate complications.  ENDOSCOPIC IMPRESSION: 1) Ring-like stricture at GE junction - dilated 16,17,18 mm balloon, slight heme, good result 2) Two columnar areas distal esophagus, < 1 cm max, biopsied ? Barrett's 3) 3 cm hiatal hernia 4) Gastritis in antrum and fundus - biopsied.  Slight erosions, erythema. 5) Otherwise normal EGD  RECOMMENDATIONS: 1.  Clear liquids until 1230 , then soft foods rest of day.  Resume prior diet tomorrow. 2.  Continue PPI   eSigned:  Gatha Mayer, MD, [facility] 09/21/2014 11:57 AM Revised: 09/21/2014 11:57 AM   CC: Earney Mallet, MD and THe Patient

## 2014-09-22 ENCOUNTER — Telehealth: Payer: Self-pay | Admitting: *Deleted

## 2014-09-22 NOTE — Telephone Encounter (Signed)
  Follow up Call-  Call back number 09/21/2014  Post procedure Call Back phone  # [phone]  Permission to leave phone message Yes     Patient questions:  Do you have a fever, pain , or abdominal swelling? No. Pain Score  0 *  Have you tolerated food without any problems? Yes.    Have you been able to return to your normal activities? Yes.    Do you have any questions about your discharge instructions: Diet   No. Medications  No. Follow up visit  No.  Do you have questions or concerns about your Care? No.  Actions: * If pain score is 4 or above: No action needed, pain <4.

## 2014-09-28 ENCOUNTER — Encounter: Payer: Self-pay | Admitting: Internal Medicine

## 2014-09-28 DIAGNOSIS — K2270 Barrett's esophagus without dysplasia: Secondary | ICD-10-CM | POA: Insufficient documentation

## 2014-09-28 DIAGNOSIS — K219 Gastro-esophageal reflux disease without esophagitis: Secondary | ICD-10-CM | POA: Insufficient documentation

## 2014-09-28 DIAGNOSIS — K222 Esophageal obstruction: Secondary | ICD-10-CM

## 2014-09-28 NOTE — Progress Notes (Signed)
Quick Note:  Call from office Only finding on bxs is Barrett's - would need repeat EGD 5 yrs Ask if swallowing ok - if it is see me in 1 year in office, sooner if needed Stay on [place] - 5 year EGD recall for Barrett's, no letter ______

## 2014-10-07 ENCOUNTER — Ambulatory Visit: Payer: Self-pay | Admitting: Internal Medicine

## 2015-01-16 HISTORY — PX: TOTAL SHOULDER ARTHROPLASTY: SHX126

## 2017-04-08 ENCOUNTER — Other Ambulatory Visit (INDEPENDENT_AMBULATORY_CARE_PROVIDER_SITE_OTHER): Payer: 59

## 2017-04-08 ENCOUNTER — Encounter: Payer: Self-pay | Admitting: Internal Medicine

## 2017-04-08 ENCOUNTER — Ambulatory Visit: Payer: 59 | Admitting: Internal Medicine

## 2017-04-08 ENCOUNTER — Encounter (INDEPENDENT_AMBULATORY_CARE_PROVIDER_SITE_OTHER): Payer: Self-pay

## 2017-04-08 VITALS — BP 140/76 | HR 84 | Ht 62.5 in | Wt 140.1 lb

## 2017-04-08 DIAGNOSIS — R1319 Other dysphagia: Secondary | ICD-10-CM

## 2017-04-08 DIAGNOSIS — R1032 Left lower quadrant pain: Secondary | ICD-10-CM

## 2017-04-08 DIAGNOSIS — R10814 Left lower quadrant abdominal tenderness: Secondary | ICD-10-CM

## 2017-04-08 DIAGNOSIS — K222 Esophageal obstruction: Secondary | ICD-10-CM | POA: Diagnosis not present

## 2017-04-08 DIAGNOSIS — R1310 Dysphagia, unspecified: Secondary | ICD-10-CM | POA: Diagnosis not present

## 2017-04-08 LAB — CBC WITH DIFFERENTIAL/PLATELET
BASOS PCT: 0.5 % (ref 0.0–3.0)
Basophils Absolute: 0 10*3/uL (ref 0.0–0.1)
EOS ABS: 0.2 10*3/uL (ref 0.0–0.7)
Eosinophils Relative: 3.3 % (ref 0.0–5.0)
HCT: 36.2 % (ref 36.0–46.0)
Hemoglobin: 12.4 g/dL (ref 12.0–15.0)
LYMPHS ABS: 1.9 10*3/uL (ref 0.7–4.0)
Lymphocytes Relative: 40.4 % (ref 12.0–46.0)
MCHC: 34.2 g/dL (ref 30.0–36.0)
MCV: 88.4 fl (ref 78.0–100.0)
MONO ABS: 0.4 10*3/uL (ref 0.1–1.0)
Monocytes Relative: 8.3 % (ref 3.0–12.0)
NEUTROS ABS: 2.2 10*3/uL (ref 1.4–7.7)
NEUTROS PCT: 47.5 % (ref 43.0–77.0)
PLATELETS: 347 10*3/uL (ref 150.0–400.0)
RBC: 4.09 Mil/uL (ref 3.87–5.11)
RDW: 12.6 % (ref 11.5–15.5)
WBC: 4.7 10*3/uL (ref 4.0–10.5)

## 2017-04-08 LAB — COMPREHENSIVE METABOLIC PANEL
ALT: 15 U/L (ref 0–35)
AST: 26 U/L (ref 0–37)
Albumin: 3.8 g/dL (ref 3.5–5.2)
Alkaline Phosphatase: 57 U/L (ref 39–117)
BUN: 20 mg/dL (ref 6–23)
CO2: 31 meq/L (ref 19–32)
CREATININE: 1.31 mg/dL — AB (ref 0.40–1.20)
Calcium: 9.3 mg/dL (ref 8.4–10.5)
Chloride: 99 mEq/L (ref 96–112)
GFR: 43.83 mL/min — ABNORMAL LOW (ref >60.00)
Glucose, Bld: 92 mg/dL (ref 70–99)
Potassium: 4.5 mEq/L (ref 3.5–5.1)
SODIUM: 136 meq/L (ref 135–145)
Total Bilirubin: 0.5 mg/dL (ref 0.2–1.2)
Total Protein: 6.6 g/dL (ref 6.0–8.3)

## 2017-04-08 NOTE — Progress Notes (Signed)
Creatinine mildly elevated and GFR sl low  See if they can do a lower dose IV contrast protocol in her at CT

## 2017-04-08 NOTE — Patient Instructions (Addendum)
Your physician has requested that you go to the basement for the following lab work before leaving today: CBC/diff, CMET   You have been scheduled for an endoscopy. Please follow written instructions given to you at your visit today. If you use inhalers (even only as needed), please bring them with you on the day of your procedure.  You have been scheduled for a CT scan of the abdomen and pelvis at [place] ([address]---this is in the same building as Press [facility]).   You are scheduled on 04/09/17 at 11:00AM. You should arrive 15 minutes prior to your appointment time for registration. Please follow the written instructions below on the day of your exam:  WARNING: IF YOU ARE ALLERGIC TO IODINE/X-RAY DYE, PLEASE NOTIFY RADIOLOGY IMMEDIATELY AT [phone]! YOU WILL BE GIVEN A 13 HOUR PREMEDICATION PREP.  1) Do not eat or drink anything after 7:00AM (4 hours prior to your test) 2) You have been given 2 bottles of oral contrast to drink. The solution may taste better if refrigerated, but do NOT add ice or any other liquid to this solution. Shake well before drinking.    Drink 1 bottle of contrast @ 9:00AM (2 hours prior to your exam)  Drink 1 bottle of contrast @ 10:00AM (1 hour prior to your exam)  You may take any medications as prescribed with a small amount of water except for the following: Metformin, Glucophage, Glucovance, Avandamet, Riomet, Fortamet, Actoplus Met, Janumet, Glumetza or Metaglip. The above medications must be held the day of the exam AND 48 hours after the exam.  The purpose of you drinking the oral contrast is to aid in the visualization of your intestinal tract. The contrast solution may cause some diarrhea. Before your exam is started, you will be given a small amount of fluid to drink. Depending on your individual set of symptoms, you may also receive an intravenous injection of x-ray contrast/dye. Plan on being at [facility] for 30  minutes or longer, depending on the type of exam you are having performed.  This test typically takes 30-45 minutes to complete.  If you have any questions regarding your exam or if you need to reschedule, you may call the CT department at [phone] between the hours of 8:00 am and 5:00 pm, Monday-Friday.  ________________________________________________________________________     I appreciate the opportunity to care for you. Silvano Rusk, MD, [facility]

## 2017-04-08 NOTE — Progress Notes (Signed)
Breanna Barr 62 y.o. [birth year] [id number]  Assessment & Plan:   Encounter Diagnoses  Name Primary?  . Esophageal stricture Yes  . Esophageal dysphagia   . Left lower quadrant abdominal tenderness without rebound tenderness   . LLQ pain     She is quite tender in the left lower quadrant without peritoneal findings.  I wonder if she does not have diverticulitis, some sort of malignancy could be possible but it seems more like diverticulitis or an inflammatory condition there.  CT scanning of the abdomen and pelvis is appropriate to sort this out.  Following that an EGD with dilation will be recommended and any other testing that the CT scan indicates.  Laboratory investigation with CBC and comprehensive metabolic panel.  The risks and benefits as well as alternatives of endoscopic procedure(s) have been discussed and reviewed. All questions answered. The patient agrees to proceed.   I appreciate the opportunity to care for this patient.   Subjective:   Chief Complaint: Dysphagia and abdominal pain and early satiety  HPI The patient is known to me from prior dilation of esophageal stricture in 2016, she did well until the last year when she started to have some intermittent recurrent dysphagia which is now probably a couple of times a week with solid food like in the past.  She says she is ready for another dilation.  However over the last 1-2 months she has had early satiety, and a left upper quadrant and left lower quadrant pain and tenderness.  It hurts to lie on that side.  Bowel habits are chronically irregular and not different and there are no new medications.  She remains on her PPI.  Mild weight loss.  No fevers.  She has not had abdominal pain like this in the past and does not have a history of diverticulitis. Wt Readings from Last 3 Encounters:  04/08/17 140 lb 2 oz (63.6 kg)  09/21/14 148 lb (67.1 kg)  09/17/14 148 lb (67.1 kg)    Allergies  Allergen Reactions    . Codeine Other (See Comments)    Rapid heart rate  . Penicillins Shortness Of Breath and Other (See Comments)    Rapid heart rate   Current Meds  Medication Sig  . aspirin 81 MG tablet Take 81 mg by mouth daily.  . Coenzyme Q10 (COQ-10 PO) Take 1 tablet by mouth 2 (two) times daily.  . cyclobenzaprine (FLEXERIL) 10 MG tablet Take 20 mg by mouth at bedtime.  Marland Kitchen dexlansoprazole (DEXILANT) 60 MG capsule Take 60 mg by mouth daily.  . fenofibrate micronized (LOFIBRA) 134 MG capsule Take 1 capsule by mouth daily.  . hydrochlorothiazide (HYDRODIURIL) 25 MG tablet Take 25 mg by mouth daily.  . hydroxychloroquine (PLAQUENIL) 200 MG tablet Take 300 mg by mouth daily.  Marland Kitchen ibuprofen (ADVIL,MOTRIN) 200 MG tablet Take 200 mg by mouth every 6 (six) hours as needed.  . metoprolol succinate (TOPROL-XL) 100 MG 24 hr tablet Take 100 mg by mouth 2 (two) times daily. Take with or immediately following a meal.  . ranitidine (ZANTAC) 150 MG tablet Take 150 mg by mouth as needed for heartburn.  . topiramate (TOPAMAX) 50 MG tablet Take 100 mg by mouth at bedtime.  . traMADol (ULTRAM) 50 MG tablet Take 50 mg by mouth 3 (three) times daily.  . Vitamin D, Ergocalciferol, (DRISDOL) 50000 units CAPS capsule Take 1 capsule by mouth 2 (two) times a week.  Marland Kitchen VITAMIN D, ERGOCALCIFEROL, PO Take by mouth.  Marland Kitchen  zolpidem (AMBIEN) 10 MG tablet Take 5 mg by mouth at bedtime as needed. For sleep   Past Medical History:  Diagnosis Date  . Arthritis   . Barrett's esophagus   . Chronic headaches   . Compression fracture of thoracic vertebra (HCC)    T9  . Esophageal stricture   . Fibromyalgia   . GERD (gastroesophageal reflux disease)   . H/O hiatal hernia   . Hypertension   . Osteoporosis   . Palpitations   . Vitamin D deficiency    Past Surgical History:  Procedure Laterality Date  . ABDOMINAL HYSTERECTOMY  1989  . CARDIAC CATHETERIZATION  2013   06/07/11 [facility] mild non-obstructive CAD, normal LV wall motion and  function, EF 60-65%, normal PA pressure, mild PVD of the right iliac and right common femoral artery.  . COLONOSCOPY  Last, 2010  . ESOPHAGOGASTRODUODENOSCOPY  Multiple   With esophageal dilation  . KYPHOPLASTY  02/12/2012   Procedure: KYPHOPLASTY;  Surgeon: Erline Levine, MD;  Location: [facility];  Service: Neurosurgery;  Laterality: N/A;  Thoracic nine Kyphoplasty  . ROTATOR CUFF REPAIR Right 1997  . TOTAL SHOULDER ARTHROPLASTY Left 2017   Social History   Social History Narrative   The patient is married. She has raised her great niece and great-nephew. One as a teenager the other is an adult. No biologic children. She is a retired Conservation officer, nature.   2-4 caffeinated beverages daily   No alcohol no tobacco. Remote smoking.   family history includes Cancer - Cervical in her mother; Stroke in her father.   Review of Systems As per HPI  Objective:   Physical Exam BP 140/76 (BP Location: Left Arm, Patient Position: Sitting, Cuff Size: Normal)   Pulse 84   Ht 5' 2.5" (1.588 m) Comment: height measured without shoes  Wt 140 lb 2 oz (63.6 kg)   BMI 25.22 kg/m  Eyes are anicteric Lungs clear Heart sounds normal The abdomen is a little bit protuberant and obese, it is soft and moderately tender especially in the left lower quadrant but some in the left upper quadrant.  I can detect no masses and do not suspect abdominal wall pain though not formally tested.  Bowel sounds are present and there are no bruits.  She is alert and oriented x3 has appropriate mood and affect. There is no cyanosis or clubbing in the digits  I have reviewed data as outlined in the HPI

## 2017-04-09 ENCOUNTER — Ambulatory Visit (INDEPENDENT_AMBULATORY_CARE_PROVIDER_SITE_OTHER)
Admission: RE | Admit: 2017-04-09 | Discharge: 2017-04-09 | Disposition: A | Discharge status: Home / Self Care | Payer: 59 | Source: Ambulatory Visit | Attending: Internal Medicine | Admitting: Internal Medicine

## 2017-04-09 DIAGNOSIS — R1032 Left lower quadrant pain: Secondary | ICD-10-CM

## 2017-04-09 DIAGNOSIS — R10814 Left lower quadrant abdominal tenderness: Secondary | ICD-10-CM | POA: Diagnosis not present

## 2017-04-09 MED ORDER — IOPAMIDOL (ISOVUE-300) INJECTION 61%
80.0000 mL | Freq: Once | INTRAVENOUS | Status: AC | PRN
  Administered 2017-04-09: 80 mL via INTRAVENOUS

## 2017-04-10 NOTE — Progress Notes (Signed)
CT scan does not show diverticulitis or other cause of her abdominal pain it could be a muscle strain perhaps Next step is to schedule her EGD and possible esophageal dilation, I am willing to use a 730 slot if she wants to

## 2017-04-16 ENCOUNTER — Encounter: Payer: Self-pay | Admitting: Internal Medicine

## 2017-04-30 ENCOUNTER — Ambulatory Visit (AMBULATORY_SURGERY_CENTER): Payer: 59 | Admitting: Internal Medicine

## 2017-04-30 ENCOUNTER — Encounter: Payer: Self-pay | Admitting: Internal Medicine

## 2017-04-30 VITALS — BP 138/76 | HR 72 | Temp 97.3°F | Resp 26 | Ht 62.0 in | Wt 140.0 lb

## 2017-04-30 DIAGNOSIS — K222 Esophageal obstruction: Secondary | ICD-10-CM | POA: Diagnosis not present

## 2017-04-30 DIAGNOSIS — R1310 Dysphagia, unspecified: Secondary | ICD-10-CM

## 2017-04-30 MED ORDER — ONDANSETRON HCL 4 MG PO TABS: 4.0000 mg | ORAL_TABLET | Freq: Three times a day (TID) | ORAL | 3 refills | Status: DC | PRN

## 2017-04-30 MED ORDER — SODIUM CHLORIDE 0.9 % IV SOLN: 500.0000 mL | Freq: Once | INTRAVENOUS | Status: DC

## 2017-04-30 MED ORDER — DICYCLOMINE HCL 20 MG PO TABS: 20.0000 mg | ORAL_TABLET | Freq: Four times a day (QID) | ORAL | 2 refills | Status: DC | PRN

## 2017-04-30 MED ORDER — SUCRALFATE 1 G PO TABS: 1.0000 g | ORAL_TABLET | Freq: Four times a day (QID) | ORAL | 5 refills | Status: DC | PRN

## 2017-04-30 NOTE — Progress Notes (Signed)
To PACU, VSS. Report to [email or web address] 

## 2017-04-30 NOTE — Op Note (Addendum)
[facility] Patient Name: Breanna Barr Procedure Date: 04/30/2017 10:15 AM MRN: [id number] Endoscopist: Gatha Mayer , MD Age: 62 Referring MD:  Date of Birth: [birth year] Gender: Female Account #: [id number] Procedure:                Upper GI endoscopy Indications:              Dysphagia, Stricture of the esophagus, For therapy                            of esophageal stricture Medicines:                Propofol per Anesthesia, Monitored Anesthesia Care Procedure:                Pre-Anesthesia Assessment:                           - Prior to the procedure, a History and Physical                            was performed, and patient medications and                            allergies were reviewed. The patient's tolerance of                            previous anesthesia was also reviewed. The risks                            and benefits of the procedure and the sedation                            options and risks were discussed with the patient.                            All questions were answered, and informed consent                            was obtained. Prior Anticoagulants: The patient has                            taken no previous anticoagulant or antiplatelet                            agents. ASA Grade Assessment: III - A patient with                            severe systemic disease. After reviewing the risks                            and benefits, the patient was deemed in                            satisfactory condition to undergo the procedure.  After obtaining informed consent, the endoscope was                            passed under direct vision. Throughout the                            procedure, the patient's blood pressure, pulse, and                            oxygen saturations were monitored continuously. The                            Endoscope was introduced through the mouth, and   advanced to the second part of duodenum. The upper                            GI endoscopy was accomplished without difficulty.                            The patient tolerated the procedure well. Scope In: Scope Out: Findings:                 One benign-appearing, intrinsic moderate stenosis                            was found at the gastroesophageal junction. This                            stenosis measured 1.6 cm (inner diameter). The                            stenosis was traversed. A TTS dilator was passed                            through the scope. Dilation with an 18-19-20 mm                            balloon dilator was performed to 20 mm. The                            dilation site was examined and showed moderate                            mucosal disruption. Estimated blood loss was                            minimal.                           The examined esophagus was mildly tortuous.                           A few, small non-bleeding erosions were found in  the prepyloric region of the stomach. There were no                            stigmata of recent bleeding.                           The exam was otherwise without abnormality.                           The cardia and gastric fundus were normal on                            retroflexion. Complications:            No immediate complications. Estimated Blood Loss:     Estimated blood loss was minimal. Impression:               - Benign-appearing esophageal stenosis. Dilated.                           - Tortuous esophagus.                           - Non-bleeding erosive gastropathy.                           - The examination was otherwise normal.                           - No specimens collected. Recommendation:           - Patient has a contact number available for                            emergencies. The signs and symptoms of potential                            delayed  complications were discussed with the                            patient. Return to normal activities tomorrow.                            Written discharge instructions were provided to the                            patient.                           - Clear liquids x 1 hour then soft foods rest of                            day. Start prior diet tomorrow.                           - Patient has a contact number available for  emergencies. The signs and symptoms of potential                            delayed complications were discussed with the                            patient. Return to normal activities tomorrow.                            Written discharge instructions were provided to the                            patient.                           Think NSAIDS and/or Plaquenil related to mild                            erosive gastritis                           She will stay on PPI and H2 B                           - She requested carafate and ondansetron prn and I                            have sent Rxs Gatha Mayer, MD 04/30/2017 10:54:01 AM This report has been signed electronically.

## 2017-04-30 NOTE — Patient Instructions (Addendum)
I dilated the stricture again.  If this does not help - let me know.  A few areas of inflammation in the stomach most likely related to medications.  Try dicyclomine for the abdominal pains - as needed. I sent a prescription to pharmacy.  Call if you need me - not planning on a scheduled follow-up right now.  Follow an antireflux regimen indefinitely.  This includes:      - Do not lie down for at least 3 to 4 hours after meals.       - Raise the head of the bed 4 to 6 inches.       - Decrease excess weight.       - Avoid citrus juices and other acidic foods, alcohol, chocolate, mints, coffee and other caffeinated beverages, carbonated beverages, fatty and fried foods.       - Avoid tight-fitting clothing.       - Avoid cigarettes and other tobacco products.    I appreciate the opportunity to care for you. Gatha Mayer, MD, FACG  YOU HAD AN ENDOSCOPIC PROCEDURE TODAY AT [facility]:   Refer to the procedure report that was given to you for any specific questions about what was found during the examination.  If the procedure report does not answer your questions, please call your gastroenterologist to clarify.  If you requested that your care partner not be given the details of your procedure findings, then the procedure report has been included in a sealed envelope for you to review at your convenience later.  YOU SHOULD EXPECT: Some feelings of bloating in the abdomen. Passage of more gas than usual.  Walking can help get rid of the air that was put into your GI tract during the procedure and reduce the bloating. If you had a lower endoscopy (such as a colonoscopy or flexible sigmoidoscopy) you may notice spotting of blood in your stool or on the toilet paper. If you underwent a bowel prep for your procedure, you may not have a normal bowel movement for a few days.  Please Note:  You might notice some irritation and congestion in your nose or some drainage.   This is from the oxygen used during your procedure.  There is no need for concern and it should clear up in a day or so.  SYMPTOMS TO REPORT IMMEDIATELY:   Following upper endoscopy (EGD)  Vomiting of blood or coffee ground material  New chest pain or pain under the shoulder blades  Painful or persistently difficult swallowing  New shortness of breath  Fever of 100F or higher  Black, tarry-looking stools  For urgent or emergent issues, a gastroenterologist can be reached at any hour by calling [phone].   DIET:  Please follow a post-dilation diet, Clear liquids for 1 hour beginning at 1200, then soft foods for the rest of today (please see diet handout given to you by your recovery nurse), but then you may proceed to your regular diet as tolerated tomorrow.  Drink plenty of fluids but you should avoid alcoholic beverages for 24 hours.  MEDICATIONS: Continue present medications.  Please see handouts given to you by your recovery nurse.   ACTIVITY:  You should plan to take it easy for the rest of today and you should NOT DRIVE or use heavy machinery until tomorrow (because of the sedation medicines used during the test).    FOLLOW UP: Our staff will call the number listed on your records  the next business day following your procedure to check on you and address any questions or concerns that you may have regarding the information given to you following your procedure. If we do not reach you, we will leave a message.  However, if you are feeling well and you are not experiencing any problems, there is no need to return our call.  We will assume that you have returned to your regular daily activities without incident.  If any biopsies were taken you will be contacted by phone or by letter within the next 1-3 weeks.  Please call us at [phone] if you have not heard about the biopsies in 3 weeks.   Thank you for allowing [place] to provide for your healthcare needs  today.  SIGNATURES/CONFIDENTIALITY: You and/or your care partner have signed paperwork which will be entered into your electronic medical record.  These signatures attest to the fact that that the information above on your After Visit Summary has been reviewed and is understood.  Full responsibility of the confidentiality of this discharge information lies with you and/or your care-partner.

## 2017-04-30 NOTE — Progress Notes (Signed)
Pt's states no medical or surgical changes since previsit or office visit. 

## 2017-04-30 NOTE — Progress Notes (Signed)
Called to room to assist during endoscopic procedure.  Patient ID and intended procedure confirmed with present staff. Received instructions for my participation in the procedure from the performing physician.  

## 2017-04-30 NOTE — Progress Notes (Signed)
Patient in recovery post upper endoscopy. VSS, HR 74 with occasional PVCs upon arrival to recovery. HR 74, patient asymptomatic. CRNA and MD notified. No intervention required at this time per MD. HR ranged from 70 to 74 throughout recovery and patient remained asymptomatic. Patient recovered well and was discharged to home.

## 2017-05-01 ENCOUNTER — Telehealth: Payer: Self-pay | Admitting: *Deleted

## 2017-05-01 NOTE — Telephone Encounter (Signed)
No answer. Number identifier. Message left to call if questions or concerns. 

## 2017-05-01 NOTE — Telephone Encounter (Signed)
  Follow up Call-  Call back number 04/30/2017 09/21/2014  Post procedure Call Back phone  # [phone]  Permission to leave phone message Yes Yes  Some recent data might be hidden     Patient questions:  Message left to call us if necessary.

## 2017-05-24 ENCOUNTER — Encounter

## 2017-05-24 ENCOUNTER — Encounter: Payer: 59 | Admitting: Internal Medicine

## 2017-06-27 ENCOUNTER — Other Ambulatory Visit: Payer: Self-pay | Admitting: Internal Medicine

## 2017-06-27 MED ORDER — SUCRALFATE 1 GM/10ML PO SUSP
1.0000 g | Freq: Four times a day (QID) | ORAL | 2 refills | Status: DC
Start: 2017-06-27 — End: 2018-07-07

## 2017-06-27 MED ORDER — ONDANSETRON 4 MG PO TBDP: 4.0000 mg | ORAL_TABLET | Freq: Three times a day (TID) | ORAL | 1 refills | Status: DC | PRN

## 2017-11-13 ENCOUNTER — Ambulatory Visit: Payer: 59 | Admitting: Physician Assistant

## 2017-11-14 ENCOUNTER — Ambulatory Visit: Payer: 59 | Admitting: Physician Assistant

## 2017-12-24 ENCOUNTER — Ambulatory Visit: Payer: 59 | Admitting: Internal Medicine

## 2017-12-27 ENCOUNTER — Telehealth: Payer: Self-pay | Admitting: Internal Medicine

## 2017-12-27 NOTE — Telephone Encounter (Signed)
Patient was seen in an ED in [place], no available records in care everywhere. She was diagnosed with colitis and a UTI.  Patient's husband reports she is c/o nausea and vomiting and unable to take antibiotics prescribed for UTI and colitis.  He is advised that she should see primary care or return to the ED.  I did give her an appt for 12/31/17.  She is asked to bring the records from [place] hospital to the appt.

## 2017-12-31 ENCOUNTER — Ambulatory Visit: Payer: 59 | Admitting: Physician Assistant

## 2018-01-23 ENCOUNTER — Ambulatory Visit: Payer: 59 | Admitting: Physician Assistant

## 2018-01-24 ENCOUNTER — Ambulatory Visit: Payer: 59 | Admitting: Physician Assistant

## 2018-01-30 ENCOUNTER — Ambulatory Visit: Payer: 59 | Admitting: Physician Assistant

## 2018-02-05 ENCOUNTER — Ambulatory Visit (INDEPENDENT_AMBULATORY_CARE_PROVIDER_SITE_OTHER): Payer: 59 | Admitting: Physician Assistant

## 2018-02-05 ENCOUNTER — Encounter: Payer: Self-pay | Admitting: Physician Assistant

## 2018-02-05 VITALS — BP 130/70 | HR 82 | Ht 62.0 in | Wt 132.0 lb

## 2018-02-05 DIAGNOSIS — A498 Other bacterial infections of unspecified site: Secondary | ICD-10-CM | POA: Diagnosis not present

## 2018-02-05 DIAGNOSIS — R935 Abnormal findings on diagnostic imaging of other abdominal regions, including retroperitoneum: Secondary | ICD-10-CM

## 2018-02-05 DIAGNOSIS — R1310 Dysphagia, unspecified: Secondary | ICD-10-CM

## 2018-02-05 MED ORDER — FAMOTIDINE 40 MG PO TABS: 40.0000 mg | ORAL_TABLET | Freq: Every day | ORAL | 3 refills | Status: DC

## 2018-02-05 NOTE — Progress Notes (Addendum)
Chief Complaint: Abnormal CT of the abdomen, C-Diff  HPI:    Breanna Barr is a 63 year old Caucasian female, known to Dr. Carlean Purl, who presents clinic today for follow-up after being seen in the ER in [place] and being diagnosed with colitis as well as Cdiff.    12/27/2017 patient called our office and described being seen in [place] in the ER and being diagnosed with colitis and a UTI.  Her husband reports she was so nauseous and vomiting so much that she was unable to take antibiotics.  She was advised that she should see her primary care or return to the ED.  She was given an appointment but since then has failed to follow-up on 7 different occasions.    Today, patient presents clinic and tells me that she forgot to bring all of her records.  She tries to recall her history.  Explaining that in November she started aching all over and thought she had the flu and proceeded to the ER where she had a CT scan which showed colitis.  At that time was experiencing diarrhea as well as vomiting and some severe generalized abdominal pain.  Also was diagnosed with the UTI and started on Cipro and Flagyl as well as another antibiotic for about 7 days.  Felt better for about a week and a half but then started getting sick again.  Presented to the ER room where she had another CT which showed "I had a small kidney on the right side" and continued to show significant inflammation throughout her bowel.  She was given some Flagyl and Cipro IV and discharged with Flagyl 3 times daily and Cipro twice daily x10 days.  The third day of being on these antibiotics she was so sick that she could not tolerate them anymore.  Went back to the ER and was diagnosed with C. difficile.  Admitted for 9 days and started on Vancomycin.  Later in December she had a relapse and was back in the hospital and started on Vancomycin but this time for a 42-day taper.      Since all of this, over the past week or 2 patient has felt better.   Now only occasional abdominal pain and having soft formed stools.  Is currently on twice daily dosing of Vancomycin with decrease to once daily dosing in a day or so.  Patient was told she needed to have a colonoscopy, describes her last being at least 10 years ago.    Also continues to describe dysphagia telling me that food seems to get stuck in her throat, also with an increase in reflux ever since being taken off of her Dexilant.  Was told she cannot be on a PPI while being treated for C. difficile by the hospital and has since been put on Pepcid 20 mg twice daily which does not control her symptoms.    Social history positive for just burying her mother on this past Sunday, 02/02/2018.  Patient was also recently diagnosed with multiple artery stenosis including renal, celiac and SMA.  She is scheduled to have a CT angiography in the near future with possible stenting.    Denies fever, chills, weight loss, anorexia, blood in her stool or symptoms that awaken her from sleep.    Past Medical History:  Diagnosis Date  . Barrett's esophagus   . Chronic headaches   . Clostridium difficile infection   . Colitis   . Compression fracture of thoracic vertebra (HCC)  T9  . Esophageal stricture   . Fibromyalgia   . GERD (gastroesophageal reflux disease)   . H/O hiatal hernia   . Hypertension   . Osteoarthritis   . Osteoporosis   . Palpitations   . Rheumatoid arthritis ([place])   . Vitamin D deficiency     Past Surgical History:  Procedure Laterality Date  . ABDOMINAL HYSTERECTOMY  1989  . CARDIAC CATHETERIZATION  2013   06/07/11 [facility]) mild non-obstructive CAD, normal LV wall motion and function, EF 60-65%, normal PA pressure, mild PVD of the right iliac and right common femoral artery.  . COLONOSCOPY  Last, 2010  . ESOPHAGOGASTRODUODENOSCOPY  Multiple   With esophageal dilation  . KYPHOPLASTY  02/12/2012   Procedure: KYPHOPLASTY;  Surgeon: Erline Levine, MD;  Location: [facility] [place] [facility];   Service: Neurosurgery;  Laterality: N/A;  Thoracic nine Kyphoplasty  . ROTATOR CUFF REPAIR Right 1997  . TOTAL SHOULDER ARTHROPLASTY Left 2017    Current Outpatient Medications  Medication Sig Dispense Refill  . Coenzyme Q10 (COQ-10 PO) Take 1 tablet by mouth 2 (two) times daily.    . cyclobenzaprine (FLEXERIL) 10 MG tablet Take 20 mg by mouth at bedtime.    . famotidine (PEPCID) 20 MG tablet Take 1 tablet by mouth 2 (two) times daily.    . fenofibrate micronized (LOFIBRA) 134 MG capsule Take 1 capsule by mouth daily.  3  . ibuprofen (ADVIL,MOTRIN) 200 MG tablet Take 200 mg by mouth every 6 (six) hours as needed.    . metoprolol succinate (TOPROL-XL) 100 MG 24 hr tablet Take 100 mg by mouth 2 (two) times daily. Take with or immediately following a meal.    . ondansetron (ZOFRAN-ODT) 4 MG disintegrating tablet Take 1 tablet (4 mg total) by mouth every 8 (eight) hours as needed for nausea or vomiting. 20 tablet 1  . sucralfate (CARAFATE) 1 GM/10ML suspension Take 10 mLs (1 g total) by mouth 4 (four) times daily. 420 mL 2  . traMADol (ULTRAM) 50 MG tablet Take 50 mg by mouth 3 (three) times daily.    . Vitamin D, Ergocalciferol, (DRISDOL) 50000 units CAPS capsule Take 1 capsule by mouth 2 (two) times a week.    . zolpidem (AMBIEN) 10 MG tablet Take 5 mg by mouth at bedtime as needed. For sleep     No current facility-administered medications for this visit.     Allergies as of 02/05/2018 - Review Complete 02/05/2018  Allergen Reaction Noted  . Codeine Other (See Comments) 02/08/2012  . Penicillins Shortness Of Breath and Other (See Comments) 02/08/2012  . Prolia [denosumab] Anaphylaxis 02/05/2018    Family History  Problem Relation Age of Onset  . Cancer - Cervical Mother   . Stroke Mother   . Stroke Father   . Colon polyps Neg Hx   . Colon cancer Neg Hx     Social History   Socioeconomic History  . Marital status: Married    Spouse name: Not on file  . Number of children: 0    . Years of education: Not on file  . Highest education level: Not on file  Occupational History  . Occupation: retired  Scientific laboratory technician  . Financial resource strain: Not on file  . Food insecurity:    Worry: Not on file    Inability: Not on file  . Transportation needs:    Medical: Not on file    Non-medical: Not on file  Tobacco Use  . Smoking status: Former Smoker  Last attempt to quit: 02/11/1984    Years since quitting: 34.0  . Smokeless tobacco: Never Used  Substance and Sexual Activity  . Alcohol use: No    Alcohol/week: 0.0 standard drinks  . Drug use: No  . Sexual activity: Not on file  Lifestyle  . Physical activity:    Days per week: Not on file    Minutes per session: Not on file  . Stress: Not on file  Relationships  . Social connections:    Talks on phone: Not on file    Gets together: Not on file    Attends religious service: Not on file    Active member of club or organization: Not on file    Attends meetings of clubs or organizations: Not on file    Relationship status: Not on file  . Intimate partner violence:    Fear of current or ex partner: Not on file    Emotionally abused: Not on file    Physically abused: Not on file    Forced sexual activity: Not on file  Other Topics Concern  . Not on file  Social History Narrative   The patient is married. She has raised her great niece and great-nephew. One as a teenager the other is an adult. No biologic children. She is a retired Conservation officer, nature.   2-4 caffeinated beverages daily   No alcohol no tobacco. Remote smoking.    Review of Systems:    Constitutional: No weight loss, fever or chills Skin: No rash  Cardiovascular: No chest pain Respiratory: No SOB  Gastrointestinal: See HPI and otherwise negative Genitourinary: No dysuria  Neurological: No headache, dizziness or syncope Musculoskeletal: No new muscle or joint pain Hematologic: No bleeding  Psychiatric: No history of depression or  anxiety   Physical Exam:  Vital signs: BP 130/70   Pulse 82   Ht 5\' 2"  (1.575 m)   Wt 132 lb (59.9 kg)   BMI 24.14 kg/m   Constitutional:   Pleasant Caucasian female appears to be in NAD, Well developed, Well nourished, alert and cooperative Head:  Normocephalic and atraumatic. Eyes:   PEERL, EOMI. No icterus. Conjunctiva pink. Ears:  Normal auditory acuity. Neck:  Supple Throat: Oral cavity and pharynx without inflammation, swelling or lesion.  Respiratory: Respirations even and unlabored. Lungs clear to auscultation bilaterally.   No wheezes, crackles, or rhonchi.  Cardiovascular: Normal S1, S2. No MRG. Regular rate and rhythm. No peripheral edema, cyanosis or pallor.  Gastrointestinal:  Soft, nondistended, nontender. No rebound or guarding. Normal bowel sounds. No appreciable masses or hepatomegaly. Rectal:  Not performed.  Msk:  Symmetrical without gross deformities. Without edema, no deformity or joint abnormality.  Neurologic:  Alert and  oriented x4;  grossly normal neurologically.  Skin:   Dry and intact without significant lesions or rashes. Psychiatric: Demonstrates good judgement and reason without abnormal affect or behaviors.  Requesting recent labs and imaging.  Assessment: 1.  Abnormal CT of the abdomen: Showing colitis per the patient 2.  C. difficile: Diagnosed in December, patient currently on a 42-day taper, experiencing soft solid stools now 3.  Multiple artery stenosis 4.  Dysphasia: Chronic for the patient with multiple dilations in the past  Plan: 1.  Discussed with patient that at this time her upcoming colonoscopy is not her main health concern.  Would recommend that she continue to follow with her cardiologist/vascualr surgery to ensure that her stenosis is taken care of. 2.  Patient should finish Vancomycin taper, it  sounds as though she may be done in about 2 weeks.  Currently stools are back to soft solid with no further diarrhea or abdominal pain. 3.   Patient will need a colonoscopy and EGD with Dr. Carlean Purl.  Did explain that we will wait to schedule these until her follow-up appointment in 4 weeks.  This will need to be at least 6 to 8 weeks from today, given current antibiotics for C. difficile. 4.  Increased patient's Famotidine to 40 mg twice daily #60 with 3 refills 5.  Reviewed anti-dysphagia measures including taking small bites, chewing well and avoiding distraction while eating. 6.  Patient will try to bring records with her at her next visit.  We will also attempt to request these. 7.  Patient to follow in clinic in 4 weeks with me.  At that time, can hopefully schedule her for EGD and colonoscopy pending work-up by vascular surgery/cardiology for stenosis.  Told patient to call and let us know how this is going.  If she ends up on a blood thinner we will need to delay procedures further.  Ellouise Newer, PA-C [facility] 02/05/2018, 3:11 PM  Addendum: 02/06/18  Did receive some records for the patient, but always this is the discharge instructions for the patient which included diagnosis of colitis and UTI.  We do not have any of the actual lab or imaging results.  Ellouise Newer, PA-C  Agree with Breanna Barr's evaluation and management. 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 Depending on clinical course may need to defer endoscopic evaluation for some time. If she needs a PPI I think we can re-Rx. PPI is a risk factor for C diff but not necessarily contraindicated during Tx for C. diff  Gatha Mayer, MD, [facility]

## 2018-02-05 NOTE — Patient Instructions (Addendum)
Increase your Famotidine 40mg  to twice a day  Please call the office in about 2 weeks to schedule a follow up office visit with Anderson Malta.  Try to bring you records with you

## 2018-06-16 ENCOUNTER — Telehealth: Payer: Self-pay | Admitting: Internal Medicine

## 2018-06-16 NOTE — Telephone Encounter (Signed)
Patient was to have a follow up office visit in 4 weeks (February).  She needs OV with Dr. Carlean Purl, Please help her schedule.  Can be in person or virtual.

## 2018-06-16 NOTE — Telephone Encounter (Signed)
As per 02/05/18 OV with Breanna Barr, pt was to follow up with her cardiologist.  Pt reported experiencing dysphagia and would like to schedule EGD if appropriate.  Please call her back to discuss.

## 2018-07-07 ENCOUNTER — Other Ambulatory Visit: Payer: Self-pay

## 2018-07-07 ENCOUNTER — Ambulatory Visit (INDEPENDENT_AMBULATORY_CARE_PROVIDER_SITE_OTHER): Payer: 59 | Admitting: Internal Medicine

## 2018-07-07 ENCOUNTER — Encounter: Payer: Self-pay | Admitting: Internal Medicine

## 2018-07-07 DIAGNOSIS — Z1211 Encounter for screening for malignant neoplasm of colon: Secondary | ICD-10-CM

## 2018-07-07 DIAGNOSIS — K219 Gastro-esophageal reflux disease without esophagitis: Secondary | ICD-10-CM | POA: Diagnosis not present

## 2018-07-07 DIAGNOSIS — Z8619 Personal history of other infectious and parasitic diseases: Secondary | ICD-10-CM | POA: Diagnosis not present

## 2018-07-07 DIAGNOSIS — K222 Esophageal obstruction: Secondary | ICD-10-CM

## 2018-07-07 HISTORY — DX: Personal history of other infectious and parasitic diseases: Z86.19

## 2018-07-07 NOTE — Assessment & Plan Note (Signed)
This appears reso;ved. Understand rationale to keep off PPi but she needs it

## 2018-07-07 NOTE — Assessment & Plan Note (Signed)
Schedule screening colonoscopy Has been 10+ yrs since last

## 2018-07-07 NOTE — Assessment & Plan Note (Signed)
Symptomatic again Restart Dexilant EGD, dilation I think being off Dexilant is the problem in her case. Though might be less likely to get recurrent C diff she needs PPI Tx or is miserable from GERD and esophageal stricture

## 2018-07-07 NOTE — Progress Notes (Signed)
TELEHEALTH ENCOUNTER IN SETTING OF COVID-19 PANDEMIC - REQUESTED BY PATIENT SERVICE PROVIDED BY TELEMEDECINE - TYPE: Zoom AVPATIENT LOCATION Home PATIENT HAS CONSENTED TO TELEHEALTH VISIT PROVIDER LOCATION: OFFICE REFERRING PROVIDER: Ephriam Jenkins Barr PCP  PARTICIPANTS OTHER THAN PATIENT:none TIME SPENT ON CALL:13 mins    Breanna Barr 63 y.o. 1955/01/24 [id number]  Assessment & Plan:  GERD with stricture Symptomatic again Restart Dexilant EGD, dilation I think being off Dexilant is the problem in her case. Though might be less likely to get recurrent C diff she needs PPI Tx or is miserable from GERD and esophageal stricture  Hx of Clostridium difficile infection This appears reso;ved. Understand rationale to keep off PPi but she needs it  Colon cancer screening Schedule screening colonoscopy Has been 10+ yrs since last   I appreciate the opportunity to care for this patient. CC: Breanna Jenkins E  She is aware that I cannot guarantee transmission of COVID-19 infections through her interactions for her procedures though we are following risk reduction protocols.  Subjective:   Chief Complaint: Dysphagia and colon cancer screening.  HPI The patient is a 63 year old woman with recurrent GERD and stricture problems last dilated in 2019 with now having recurrent dysphagia to meats and bread.  She came off her PPI, Dexilant, at the recommendation of gastroenterologist in [place] when she had problems with C. difficile.  She is also strictly avoiding oral antibiotics as much as possible and is on topical agents and drops for a periorbital cellulitis on the right.  She reports that it is been 10 years or more since she had a screening colonoscopy and would like to have 1 of those as well.  She was seen by [place] in January and there was a question of mesenteric ischemia and we deferred any type of endoscopic evaluation at that time, she was completing a long-term taper of  vancomycin.  She is not having problems now that she has had her C. difficile treated i.e. no pain after eating etc. no unintentional weight loss no diarrhea. Allergies  Allergen Reactions  . Codeine Other (See Comments)    Rapid heart rate  . Penicillins Shortness Of Breath and Other (See Comments)    Rapid heart rate  . Prolia [Denosumab] Anaphylaxis  . Ciprofloxacin Other (See Comments)    Gave her C-diff   Current Meds  Medication Sig  . amLODipine (NORVASC) 5 MG tablet Take 5 mg by mouth daily.  . Coenzyme Q10 (COQ-10 PO) Take 1 tablet by mouth daily.   . cyclobenzaprine (FLEXERIL) 10 MG tablet Take 20 mg by mouth at bedtime.  . famotidine (PEPCID) 40 MG tablet Take 40 mg by mouth 2 (two) times daily.  . fenofibrate micronized (LOFIBRA) 134 MG capsule Take 1 capsule by mouth daily.  Marland Kitchen ibuprofen (ADVIL,MOTRIN) 200 MG tablet Take 200 mg by mouth every 6 (six) hours as needed.  . metoprolol succinate (TOPROL-XL) 100 MG 24 hr tablet Take 100 mg by mouth 2 (two) times daily. Take with or immediately following a meal.  . ondansetron (ZOFRAN-ODT) 4 MG disintegrating tablet Take 1 tablet (4 mg total) by mouth every 8 (eight) hours as needed for nausea or vomiting.  . traMADol (ULTRAM) 50 MG tablet Take 50 mg by mouth 3 (three) times daily.  Marland Kitchen VITAMIN D PO Take 1 capsule by mouth daily.  Marland Kitchen zolpidem (AMBIEN) 10 MG tablet Take 5 mg by mouth at bedtime as needed. For sleep   Past Medical History:  Diagnosis Date  .  Barrett's esophagus   . Chronic headaches   . Clostridium difficile infection   . Colitis   . Compression fracture of thoracic vertebra (HCC)    T9  . Esophageal stricture   . Fibromyalgia   . GERD (gastroesophageal reflux disease)   . H/O hiatal hernia   . Hypertension   . Osteoarthritis   . Osteoporosis   . Palpitations   . Renal artery stenosis ([place])   . Rheumatoid arthritis ([place])   . Vitamin D deficiency    Past Surgical History:  Procedure Laterality Date  .  ABDOMINAL HYSTERECTOMY  1989  . CARDIAC CATHETERIZATION  2013   06/07/11 [facility]) mild non-obstructive CAD, normal LV wall motion and function, EF 60-65%, normal PA pressure, mild PVD of the right iliac and right common femoral artery.  . COLONOSCOPY  Last, 2010  . ESOPHAGOGASTRODUODENOSCOPY  Multiple   With esophageal dilation  . KYPHOPLASTY  02/12/2012   Procedure: KYPHOPLASTY;  Surgeon: Erline Levine, MD;  Location: [place] [facility];  Service: Neurosurgery;  Laterality: N/A;  Thoracic nine Kyphoplasty  . ROTATOR CUFF REPAIR Right 1997  . TOTAL SHOULDER ARTHROPLASTY Left 2017   Social History   Social History Narrative   The patient is married. She has raised her great niece and great-nephew. One as a teenager the other is an adult. No biologic children. She is a retired Conservation officer, nature.   2-4 caffeinated beverages daily   No alcohol no tobacco. Remote smoking.   family history includes Cancer - Cervical in her mother; Stroke in her father and mother.   Review of Systems Right perioral cellulitis

## 2018-07-07 NOTE — Patient Instructions (Signed)
As we discussed, restart the Dexilant that you have on hand.  Take it about 30 minutes before breakfast.  You can stop the Pepcid though it is okay to use that as needed while the [place] heals things.  We will arrange for you to have an upper GI endoscopy with balloon dilation of the esophagus like you have had in the past, and a preventive or screening colonoscopy.  I appreciate the opportunity to care for you. Gatha Mayer, MD, [facility]

## 2018-07-14 ENCOUNTER — Telehealth: Payer: Self-pay | Admitting: Internal Medicine

## 2018-07-14 NOTE — Telephone Encounter (Signed)
Spoke with patient regarding Covid-19 screening questions °Covid-19 Screening Questions ° °Do you now or have you had a fever in the last 14 days?     No ° °Do you have any respiratory symptoms of shortness of breath or cough now or in the last 14 days?    No ° °Do you have any family members or close contacts with diagnosed or suspected Covid-19 in the past 14 days?     No ° °Have you been tested for Covid-19 and found to be positive?    No ° °Pt made aware of that care partner may wait in the car or come up to the lobby during the procedure but will need to provide their own mask. ° °  °

## 2018-07-15 ENCOUNTER — Ambulatory Visit (AMBULATORY_SURGERY_CENTER): Payer: 59 | Admitting: Internal Medicine

## 2018-07-15 ENCOUNTER — Encounter: Payer: Self-pay | Admitting: Internal Medicine

## 2018-07-15 ENCOUNTER — Other Ambulatory Visit: Payer: Self-pay

## 2018-07-15 VITALS — BP 166/79 | HR 73 | Temp 98.9°F | Resp 20 | Ht 62.5 in | Wt 133.0 lb

## 2018-07-15 DIAGNOSIS — R1310 Dysphagia, unspecified: Secondary | ICD-10-CM

## 2018-07-15 DIAGNOSIS — K222 Esophageal obstruction: Secondary | ICD-10-CM | POA: Diagnosis not present

## 2018-07-15 DIAGNOSIS — K219 Gastro-esophageal reflux disease without esophagitis: Secondary | ICD-10-CM

## 2018-07-15 DIAGNOSIS — Z1211 Encounter for screening for malignant neoplasm of colon: Secondary | ICD-10-CM

## 2018-07-15 DIAGNOSIS — K449 Diaphragmatic hernia without obstruction or gangrene: Secondary | ICD-10-CM | POA: Diagnosis not present

## 2018-07-15 DIAGNOSIS — D124 Benign neoplasm of descending colon: Secondary | ICD-10-CM | POA: Diagnosis not present

## 2018-07-15 MED ORDER — SODIUM CHLORIDE 0.9 % IV SOLN: 500.0000 mL | Freq: Once | INTRAVENOUS | Status: DC

## 2018-07-15 NOTE — Progress Notes (Signed)
Breanna Barr took temp and Judy Branson took vitals. 

## 2018-07-15 NOTE — Progress Notes (Signed)
Called to room to assist during endoscopic procedure.  Patient ID and intended procedure confirmed with present staff. Received instructions for my participation in the procedure from the performing physician.  

## 2018-07-15 NOTE — Op Note (Signed)
[facility] Patient Name: Breanna Barr Procedure Date: 07/15/2018 2:46 PM MRN: [id number] Endoscopist: Gatha Mayer , MD Age: 63 Referring MD:  Date of Birth: 1955-06-24 Gender: Female Account #: [id number] Procedure:                Upper GI endoscopy Indications:              Dysphagia, Stricture of the esophagus, For therapy                            of esophageal stricture Medicines:                Propofol per Anesthesia, Monitored Anesthesia Care Procedure:                Pre-Anesthesia Assessment:                           - Prior to the procedure, a History and Physical                            was performed, and patient medications and                            allergies were reviewed. The patient's tolerance of                            previous anesthesia was also reviewed. The risks                            and benefits of the procedure and the sedation                            options and risks were discussed with the patient.                            All questions were answered, and informed consent                            was obtained. Prior Anticoagulants: The patient has                            taken no previous anticoagulant or antiplatelet                            agents. ASA Grade Assessment: II - A patient with                            mild systemic disease. After reviewing the risks                            and benefits, the patient was deemed in                            satisfactory condition to undergo the procedure.  After obtaining informed consent, the endoscope was                            passed under direct vision. Throughout the                            procedure, the patient's blood pressure, pulse, and                            oxygen saturations were monitored continuously. The                            Endoscope was introduced through the mouth, and                            advanced  to the second part of duodenum. The upper                            GI endoscopy was accomplished without difficulty.                            The patient tolerated the procedure well. Scope In: Scope Out: Findings:                 One benign-appearing, intrinsic moderate stenosis                            was found at the gastroesophageal junction. The                            stenosis was traversed. A TTS dilator was passed                            through the scope. Dilation with an 18-19-20 mm                            balloon dilator was performed to 20 mm. The                            dilation site was examined and showed moderate                            mucosal disruption. Estimated blood loss was                            minimal.                           A small hiatal hernia was present.                           The exam was otherwise without abnormality.                           The cardia and gastric fundus were  normal on                            retroflexion. Complications:            No immediate complications. Estimated Blood Loss:     Estimated blood loss was minimal. Impression:               - Benign-appearing esophageal stenosis. Dilated.                           - Small hiatal hernia.                           - The examination was otherwise normal.                           - No specimens collected. Recommendation:           - Clear liquids x 1 hour then soft foods rest of                            day. Start prior diet tomorrow.                           - Continue present medications.                           - DO NOT STOP DEXILANT (PPI) - EVEN IF GET C DIFF                            AGAIN - SHE WILL GET ANOTHER STRICTURE Gatha Mayer, MD 07/15/2018 3:41:01 PM This report has been signed electronically.

## 2018-07-15 NOTE — Progress Notes (Signed)
Pt's states no medical or surgical changes since previsit or office visit. 

## 2018-07-15 NOTE — Progress Notes (Signed)
Report to PACU, RN, vss, BBS= Clear.  

## 2018-07-15 NOTE — Patient Instructions (Addendum)
I found and removed 1 tiny colon polyp. I dilated the esophagus.  I will let you know pathology results and when to have another routine colonoscopy by mail and/or My Chart.  Stay on the [place]. You need that - even if you get C diff again (hope not).  I appreciate the opportunity to care for you. Gatha Mayer, MD, [facility]  Await for biopsy results Polyps (handout given) Clear liquids for 1 hour then soft foods for the rest of the day. Resume regular diet Wednesday morning. Esophagitis and Stricture (handout given) Post esophageal Dilation Diet YOU HAD AN ENDOSCOPIC PROCEDURE TODAY AT [facility]:   Refer to the procedure report that was given to you for any specific questions about what was found during the examination.  If the procedure report does not answer your questions, please call your gastroenterologist to clarify.  If you requested that your care partner not be given the details of your procedure findings, then the procedure report has been included in a sealed envelope for you to review at your convenience later.  YOU SHOULD EXPECT: Some feelings of bloating in the abdomen. Passage of more gas than usual.  Walking can help get rid of the air that was put into your GI tract during the procedure and reduce the bloating. If you had a lower endoscopy (such as a colonoscopy or flexible sigmoidoscopy) you may notice spotting of blood in your stool or on the toilet paper. If you underwent a bowel prep for your procedure, you may not have a normal bowel movement for a few days.  Please Note:  You might notice some irritation and congestion in your nose or some drainage.  This is from the oxygen used during your procedure.  There is no need for concern and it should clear up in a day or so.  SYMPTOMS TO REPORT IMMEDIATELY:   Following lower endoscopy (colonoscopy or flexible sigmoidoscopy):  Excessive amounts of blood in the stool  Significant tenderness or  worsening of abdominal pains  Swelling of the abdomen that is new, acute  Fever of 100F or higher   Following upper endoscopy (EGD)  Vomiting of blood or coffee ground material  New chest pain or pain under the shoulder blades  Painful or persistently difficult swallowing  New shortness of breath  Fever of 100F or higher  Black, tarry-looking stools  For urgent or emergent issues, a gastroenterologist can be reached at any hour by calling [phone].   DIET:  We do recommend you do a Post Dilatation Diet (handout given).  Drink plenty of fluids but you should avoid alcoholic beverages for 24 hours.  ACTIVITY:  You should plan to take it easy for the rest of today and you should NOT DRIVE or use heavy machinery until tomorrow (because of the sedation medicines used during the test).    FOLLOW UP: Our staff will call the number listed on your records 48-72 hours following your procedure to check on you and address any questions or concerns that you may have regarding the information given to you following your procedure. If we do not reach you, we will leave a message.  We will attempt to reach you two times.  During this call, we will ask if you have developed any symptoms of COVID 19. If you develop any symptoms (ie: fever, flu-like symptoms, shortness of breath, cough etc.) before then, please call [phone].  If you test positive for Covid 19 in the  2 weeks post procedure, please call and report this information to [place].    If any biopsies were taken you will be contacted by phone or by letter within the next 1-3 weeks.  Please call us at [phone] if you have not heard about the biopsies in 3 weeks.    SIGNATURES/CONFIDENTIALITY: You and/or your care partner have signed paperwork which will be entered into your electronic medical record.  These signatures attest to the fact that that the information above on your After Visit Summary has been reviewed and is understood.   Full responsibility of the confidentiality of this discharge information lies with you and/or your care-partner.

## 2018-07-15 NOTE — Op Note (Signed)
[place] Patient Name: Breanna Barr Procedure Date: 07/15/2018 2:46 PM MRN: [id number] Endoscopist: Gatha Mayer , MD Age: 63 Referring MD:  Date of Birth: [birth year] Gender: Female Account #: [id number] Procedure:                Colonoscopy Indications:              Screening for colorectal malignant neoplasm Medicines:                Propofol per Anesthesia, Monitored Anesthesia Care Procedure:                Pre-Anesthesia Assessment:                           - Prior to the procedure, a History and Physical                            was performed, and patient medications and                            allergies were reviewed. The patient's tolerance of                            previous anesthesia was also reviewed. The risks                            and benefits of the procedure and the sedation                            options and risks were discussed with the patient.                            All questions were answered, and informed consent                            was obtained. Prior Anticoagulants: The patient has                            taken no previous anticoagulant or antiplatelet                            agents. ASA Grade Assessment: II - A patient with                            mild systemic disease. After reviewing the risks                            and benefits, the patient was deemed in                            satisfactory condition to undergo the procedure.                           After obtaining informed consent, the colonoscope  was passed under direct vision. Throughout the                            procedure, the patient's blood pressure, pulse, and                            oxygen saturations were monitored continuously. The                            Colonoscope was introduced through the anus and                            advanced to the the cecum, identified by                             appendiceal orifice and ileocecal valve. The                            colonoscopy was somewhat difficult due to                            significant looping. Successful completion of the                            procedure was aided by applying abdominal pressure.                            The patient tolerated the procedure well. The                            quality of the bowel preparation was adequate. The                            ileocecal valve, appendiceal orifice, and rectum                            were photographed. The bowel preparation used was                            Miralax via split dose instruction. Scope In: 3:09:45 PM Scope Out: 3:32:59 PM Scope Withdrawal Time: 0 hours 15 minutes 44 seconds  Total Procedure Duration: 0 hours 23 minutes 14 seconds  Findings:                 The perianal and digital rectal examinations were                            normal.                           A diminutive polyp was found in the descending                            colon. The polyp was sessile. The polyp was removed  with a cold snare. Resection and retrieval were                            complete. Verification of patient identification                            for the specimen was done. Estimated blood loss was                            minimal.                           The exam was otherwise without abnormality on                            direct and retroflexion views. Complications:            No immediate complications. Estimated Blood Loss:     Estimated blood loss was minimal. Impression:               - One diminutive polyp in the descending colon,                            removed with a cold snare. Resected and retrieved.                           - The examination was otherwise normal on direct                            and retroflexion views. Deep LLQ pressure required                            to overcome  looping Recommendation:           - Patient has a contact number available for                            emergencies. The signs and symptoms of potential                            delayed complications were discussed with the                            patient. Return to normal activities tomorrow.                            Written discharge instructions were provided to the                            patient.                           - Clear liquids x 1 hour then soft foods rest of                            day. Start prior  diet tomorrow.                           - Continue present medications.                           - Repeat colonoscopy is recommended. The                            colonoscopy date will be determined after pathology                            results from today's exam become available for                            review.                           - Consdier extra/different prep next time                           Adequiate exam - required sig lavage and suctioning Gatha Mayer, MD 07/15/2018 3:43:52 PM This report has been signed electronically.

## 2018-07-17 ENCOUNTER — Telehealth: Payer: Self-pay

## 2018-07-17 ENCOUNTER — Telehealth: Payer: Self-pay | Admitting: *Deleted

## 2018-07-17 NOTE — Telephone Encounter (Signed)
First post procedure follow up call, no answer 

## 2018-07-17 NOTE — Telephone Encounter (Signed)
  Follow up Call-  Call back number 07/15/2018 04/30/2017  Post procedure Call Back phone  # [phone]  Permission to leave phone message Yes Yes  Some recent data might be hidden     Patient questions:  Do you have a fever, pain , or abdominal swelling? No. Pain Score  0 *  Have you tolerated food without any problems? Yes.    Have you been able to return to your normal activities? Yes.    Do you have any questions about your discharge instructions: Diet   No. Medications  No. Follow up visit  No.  Do you have questions or concerns about your Care? No.  Actions: * If pain score is 4 or above: No action needed, pain <4.  1. Have you developed a fever since your procedure? NO  2.   Have you had an respiratory symptoms (SOB or cough) since your procedure? NO  3.   Have you tested positive for COVID 19 since your procedure NO  4.   Have you had any family members/close contacts diagnosed with the COVID 19 since your procedure?  NO   If yes to any of these questions please route to Joylene John, RN and Alphonsa Gin, RN.

## 2018-07-22 ENCOUNTER — Encounter: Payer: Self-pay | Admitting: Internal Medicine

## 2018-07-22 DIAGNOSIS — Z86010 Personal history of colonic polyps: Secondary | ICD-10-CM

## 2018-07-22 DIAGNOSIS — Z860101 Personal history of adenomatous and serrated colon polyps: Secondary | ICD-10-CM | POA: Insufficient documentation

## 2018-07-22 HISTORY — DX: Personal history of colonic polyps: Z86.010

## 2018-07-22 HISTORY — DX: Personal history of adenomatous and serrated colon polyps: Z86.0101

## 2018-07-22 NOTE — Progress Notes (Signed)
Tub adenoma Recall 5 yrs due to lavage to adequate prep My Chart

## 2018-08-19 ENCOUNTER — Telehealth: Payer: Self-pay | Admitting: Internal Medicine

## 2018-08-19 NOTE — Telephone Encounter (Signed)
I called and got her voice mail. Left her a detailed message that I hadn't called her today. Left our # to call back and we'll try to sort thru this.

## 2018-08-19 NOTE — Telephone Encounter (Signed)
Pt stated that she is returning your call.  °

## 2018-08-31 ENCOUNTER — Other Ambulatory Visit: Payer: Self-pay | Admitting: Physician Assistant

## 2018-09-01 NOTE — Telephone Encounter (Signed)
Dr. Carlean Purl - I see you scoped this pt in June and indicated she should continue Dexilant daily. Do you want her to take Pepcid as well? Thank you.

## 2019-02-11 ENCOUNTER — Ambulatory Visit (INDEPENDENT_AMBULATORY_CARE_PROVIDER_SITE_OTHER): Payer: 59 | Admitting: Internal Medicine

## 2019-02-11 ENCOUNTER — Encounter: Payer: Self-pay | Admitting: Internal Medicine

## 2019-02-11 ENCOUNTER — Other Ambulatory Visit: Payer: Self-pay

## 2019-02-11 VITALS — BP 128/80 | HR 72 | Temp 97.2°F | Ht 62.5 in | Wt 144.8 lb

## 2019-02-11 DIAGNOSIS — Z01818 Encounter for other preprocedural examination: Secondary | ICD-10-CM

## 2019-02-11 DIAGNOSIS — R0789 Other chest pain: Secondary | ICD-10-CM

## 2019-02-11 DIAGNOSIS — K219 Gastro-esophageal reflux disease without esophagitis: Secondary | ICD-10-CM | POA: Diagnosis not present

## 2019-02-11 DIAGNOSIS — K222 Esophageal obstruction: Secondary | ICD-10-CM

## 2019-02-11 DIAGNOSIS — Z8619 Personal history of other infectious and parasitic diseases: Secondary | ICD-10-CM | POA: Diagnosis not present

## 2019-02-11 DIAGNOSIS — R1310 Dysphagia, unspecified: Secondary | ICD-10-CM | POA: Diagnosis not present

## 2019-02-11 DIAGNOSIS — R1319 Other dysphagia: Secondary | ICD-10-CM

## 2019-02-11 MED ORDER — SUCRALFATE 1 G PO TABS: 1.0000 g | ORAL_TABLET | Freq: Three times a day (TID) | ORAL | 0 refills | Status: DC

## 2019-02-11 MED ORDER — AMBULATORY NON FORMULARY MEDICATION: 1 refills | Status: DC

## 2019-02-11 NOTE — Progress Notes (Signed)
Breanna Barr 64 y.o. 06-09-1955 ON:[id number]  Assessment & Plan:   Encounter Diagnoses  Name Primary?  . Esophageal dysphagia Yes  . GERD with stricture   . History of Clostridioides difficile infection   . Chest wall pain   . Encounter for other preprocedural examination     Evaluate with upper GI endoscopy and dilate again.  Question need biopsy of stricture.  Question underlying motility issue or other process.  She has never seem to look like eosinophilic esophagitis but we will need to consider that and take biopsies perhaps.  She will need Covid testing a couple of days prior to the procedure and understands the rationale for that and consents.  Consideration for eventual steroid injection in the stricture which would need to be done at the hospital most likely.  Consider tertiary referral if I cannot get her better.  Hope and plan is for long-lasting results.  She will modify her diet in the meantime.  Printed prescription for GI cocktail provided to the patient.  See if she can get that from a compounding pharmacy.  The risks and benefits as well as alternatives of endoscopic procedure(s) have been discussed and reviewed. All questions answered. The patient agrees to proceed.   I appreciate the opportunity to care for this patient. CC: Thea Alken   Subjective:   Chief Complaint: Dysphagia  HPI 64 year old white woman with GERD and stricture, status post dilation to 20 mm with a balloon in June 2020, and also history of C. difficile infections.  She says after the June 2020 dilation she was good for a month or 2 but then developed progressive intermittent dysphagia to solids.  Its been quite severe in the past month or so occurring 2 or 3 times a week.  Breads [place] fries other more solid meats are a problem.  She can get severe pain radiating up and into the chest.  She says it feels "raw" inside.  She feels burning in her chest most of the time.  She went to the  emergency department in [place] last week because of pain that radiated up into the shoulders and neck it was not associated with eating.  She was given a GI cocktail that helped tremendously.  She says a CT the chest in EKG and labs etc. were all okay and they told her was not her heart.  She has had dilations over the years even in [place] frequently.  She does not seem to get long-lasting benefit even though she has remained on her [place] and she is also using Pepcid right now which she thinks is helping.  No recurrent C. difficile.  Social history notable for mother died last year, a sister has lung cancer she helps her family quite a bit. Allergies  Allergen Reactions  . Codeine Other (See Comments)    Rapid heart rate  . Penicillins Shortness Of Breath and Other (See Comments)    Rapid heart rate  . Prolia [Denosumab] Anaphylaxis  . Ciprofloxacin Other (See Comments)    Gave her C-diff   Current Meds  Medication Sig  . amLODipine (NORVASC) 5 MG tablet Take 5 mg by mouth daily.  . Coenzyme Q10 (COQ-10 PO) Take 1 tablet by mouth daily.   . cyclobenzaprine (FLEXERIL) 10 MG tablet Take 20 mg by mouth at bedtime.  Marland Kitchen dexlansoprazole (DEXILANT) 60 MG capsule Take 1 capsule (60 mg total) by mouth daily before breakfast.  . fenofibrate micronized (LOFIBRA) 134 MG capsule Take 1  capsule by mouth daily.  Marland Kitchen ibuprofen (ADVIL,MOTRIN) 200 MG tablet Take 200 mg by mouth every 6 (six) hours as needed.  . metoprolol succinate (TOPROL-XL) 100 MG 24 hr tablet Take 100 mg by mouth 2 (two) times daily. Take with or immediately following a meal.  . VITAMIN D PO Take 1 capsule by mouth daily.  Marland Kitchen zolpidem (AMBIEN) 10 MG tablet Take 5 mg by mouth at bedtime as needed. For sleep   Past Medical History:  Diagnosis Date  . Barrett's esophagus   . Chronic headaches   . Clostridium difficile infection   . Colitis   . Compression fracture of thoracic vertebra (HCC)    T9  . Esophageal stricture   .  Fibromyalgia   . GERD (gastroesophageal reflux disease)   . H/O hiatal hernia   . Hx of adenomatous polyp of colon 07/22/2018  . Hx of Clostridium difficile infection 07/07/2018  . Hypertension   . Osteoarthritis   . Osteoporosis   . Palpitations   . Renal artery stenosis ([place])   . Rheumatoid arthritis ([place])   . Vitamin D deficiency    Past Surgical History:  Procedure Laterality Date  . ABDOMINAL HYSTERECTOMY  1989  . CARDIAC CATHETERIZATION  2013   06/07[facility]11 [facility]) mild non-obstructive CAD, normal LV wall motion and function, EF 60-65%, normal PA pressure, mild PVD of the right iliac and right common femoral artery.  . COLONOSCOPY  Last, 2010  . ESOPHAGOGASTRODUODENOSCOPY  Multiple   With esophageal dilation  . KYPHOPLASTY  02/12/2012   Procedure: KYPHOPLASTY;  Surgeon: Erline Levine, MD;  Location: [place] [facility];  Service: Neurosurgery;  Laterality: N/A;  Thoracic nine Kyphoplasty  . ROTATOR CUFF REPAIR Right 1997  . TOTAL SHOULDER ARTHROPLASTY Left 2017   Social History   Social History Narrative   The patient is married. She has raised her great niece and great-nephew. One as a teenager the other is an adult. No biologic children. She is a retired Conservation officer, nature.   2-4 caffeinated beverages daily   No alcohol no tobacco. Remote smoking.   family history includes Cancer - Cervical in her mother; Esophageal cancer in her maternal uncle; Stroke in her father and mother.   Review of Systems As per HPI, there is some situational stress with ill family members  Objective:   Physical Exam @BP  128/80   Pulse 72   Temp (!) 97.2 F (36.2 C)   Ht 5' 2.5" (1.588 m)   Wt 144 lb 12.8 oz (65.7 kg)   BMI 26.06 kg/m @  General:  NAD Eyes:   anicteric Lungs:  Clear The upper chest wall is tender mildly so Heart::  S1S2 no rubs, murmurs or gallops   Data Reviewed:  I have requested the records from her emergency department visit last week

## 2019-02-11 NOTE — Patient Instructions (Signed)
You have been scheduled for an endoscopy. Please follow written instructions given to you at your visit today. If you use inhalers (even only as needed), please bring them with you on the day of your procedure.   We have given you a printed rx for a GI cocktail to take to a compound pharmacy.   We have sent the following medications to your pharmacy for you to pick up at your convenience: carafate   Use Gaviscon as needed, this is over the counter.   We are going to get your records from [facility] in [place] for review.   I appreciate the opportunity to care for you. Silvano Rusk, MD, [facility]

## 2019-02-13 ENCOUNTER — Other Ambulatory Visit: Payer: Self-pay

## 2019-02-13 ENCOUNTER — Ambulatory Visit (INDEPENDENT_AMBULATORY_CARE_PROVIDER_SITE_OTHER): Payer: 59

## 2019-02-13 DIAGNOSIS — Z1159 Encounter for screening for other viral diseases: Secondary | ICD-10-CM

## 2019-02-16 ENCOUNTER — Encounter: Payer: Self-pay | Admitting: Internal Medicine

## 2019-02-16 ENCOUNTER — Other Ambulatory Visit: Payer: Self-pay

## 2019-02-16 ENCOUNTER — Telehealth: Payer: Self-pay

## 2019-02-16 ENCOUNTER — Ambulatory Visit (AMBULATORY_SURGERY_CENTER): Payer: 59 | Admitting: Internal Medicine

## 2019-02-16 VITALS — BP 134/77 | HR 85 | Temp 96.9°F | Resp 12 | Ht 62.0 in | Wt 144.0 lb

## 2019-02-16 DIAGNOSIS — K208 Other esophagitis without bleeding: Secondary | ICD-10-CM

## 2019-02-16 DIAGNOSIS — K2950 Unspecified chronic gastritis without bleeding: Secondary | ICD-10-CM

## 2019-02-16 DIAGNOSIS — K3189 Other diseases of stomach and duodenum: Secondary | ICD-10-CM

## 2019-02-16 DIAGNOSIS — K449 Diaphragmatic hernia without obstruction or gangrene: Secondary | ICD-10-CM | POA: Diagnosis not present

## 2019-02-16 DIAGNOSIS — K222 Esophageal obstruction: Secondary | ICD-10-CM | POA: Diagnosis present

## 2019-02-16 DIAGNOSIS — R1310 Dysphagia, unspecified: Secondary | ICD-10-CM | POA: Diagnosis not present

## 2019-02-16 DIAGNOSIS — K2210 Ulcer of esophagus without bleeding: Secondary | ICD-10-CM

## 2019-02-16 DIAGNOSIS — K2100 Gastro-esophageal reflux disease with esophagitis, without bleeding: Secondary | ICD-10-CM | POA: Diagnosis not present

## 2019-02-16 LAB — SARS CORONAVIRUS 2 (TAT 6-24 HRS): SARS Coronavirus 2: NEGATIVE

## 2019-02-16 MED ORDER — ONDANSETRON 4 MG PO TBDP: 4.0000 mg | ORAL_TABLET | Freq: Three times a day (TID) | ORAL | 1 refills | Status: DC | PRN

## 2019-02-16 MED ORDER — SODIUM CHLORIDE 0.9 % IV SOLN: 500.0000 mL | Freq: Once | INTRAVENOUS | Status: DC

## 2019-02-16 NOTE — Patient Instructions (Addendum)
I dilated the stricture again and also took some biopsies to try to open it up more.  There were small ulcers or erosions in the esophagus and also some stomach inflammation. Also hiatal hernia.  I think we will need to assess esophageal function again as well - may need something called an esophageal manometry nd possibly something called pH/impedance testing.  Once I see the pathology will let you know next steps.  I appreciate the opportunity to care for you. Gatha Mayer, MD, [facility]  Esophagitis handout given to patient. Hiatal hernia handout given to patient. Dilation diet handout given to patient.  Continue present medications.  Await pathology results.  YOU HAD AN ENDOSCOPIC PROCEDURE TODAY AT [facility]:   Refer to the procedure report that was given to you for any specific questions about what was found during the examination.  If the procedure report does not answer your questions, please call your gastroenterologist to clarify.  If you requested that your care partner not be given the details of your procedure findings, then the procedure report has been included in a sealed envelope for you to review at your convenience later.  YOU SHOULD EXPECT: Some feelings of bloating in the abdomen. Passage of more gas than usual.  Walking can help get rid of the air that was put into your GI tract during the procedure and reduce the bloating. If you had a lower endoscopy (such as a colonoscopy or flexible sigmoidoscopy) you may notice spotting of blood in your stool or on the toilet paper. If you underwent a bowel prep for your procedure, you may not have a normal bowel movement for a few days.  Please Note:  You might notice some irritation and congestion in your nose or some drainage.  This is from the oxygen used during your procedure.  There is no need for concern and it should clear up in a day or so.  SYMPTOMS TO REPORT IMMEDIATELY:  Following upper endoscopy  (EGD)  Vomiting of blood or coffee ground material  New chest pain or pain under the shoulder blades  Painful or persistently difficult swallowing  New shortness of breath  Fever of 100F or higher  Black, tarry-looking stools  For urgent or emergent issues, a gastroenterologist can be reached at any hour by calling [phone].   DIET:  We do recommend a small meal at first, but then you may proceed to your regular diet.  Drink plenty of fluids but you should avoid alcoholic beverages for 24 hours.  ACTIVITY:  You should plan to take it easy for the rest of today and you should NOT DRIVE or use heavy machinery until tomorrow (because of the sedation medicines used during the test).    FOLLOW UP: Our staff will call the number listed on your records 48-72 hours following your procedure to check on you and address any questions or concerns that you may have regarding the information given to you following your procedure. If we do not reach you, we will leave a message.  We will attempt to reach you two times.  During this call, we will ask if you have developed any symptoms of COVID 19. If you develop any symptoms (ie: fever, flu-like symptoms, shortness of breath, cough etc.) before then, please call [phone].  If you test positive for Covid 19 in the 2 weeks post procedure, please call and report this information to [place].    If any biopsies were taken you  will be contacted by phone or by letter within the next 1-3 weeks.  Please call us at [phone] if you have not heard about the biopsies in 3 weeks.    SIGNATURES/CONFIDENTIALITY: You and/or your care partner have signed paperwork which will be entered into your electronic medical record.  These signatures attest to the fact that that the information above on your After Visit Summary has been reviewed and is understood.  Full responsibility of the confidentiality of this discharge information lies with you and/or your  care-partner.

## 2019-02-16 NOTE — Progress Notes (Signed)
Pt's states no medical or surgical changes since previsit or office visit.  Temp- June Vitals- Donna 

## 2019-02-16 NOTE — Progress Notes (Signed)
Report to PACU, RN, vss, BBS= Clear.  

## 2019-02-16 NOTE — Op Note (Signed)
[facility] Patient Name: Breanna Barr Procedure Date: 02/16/2019 10:29 AM MRN: [id number] Endoscopist: Gatha Mayer , MD Age: 64 Referring MD:  Date of Birth: 1955-10-15 Gender: Female Account #: [id number] Procedure:                Upper GI endoscopy Indications:              Dysphagia, Stricture of the esophagus, For therapy                            of esophageal stricture Medicines:                Propofol per Anesthesia, Monitored Anesthesia Care Procedure:                Pre-Anesthesia Assessment:                           - Prior to the procedure, a History and Physical                            was performed, and patient medications and                            allergies were reviewed. The patient's tolerance of                            previous anesthesia was also reviewed. The risks                            and benefits of the procedure and the sedation                            options and risks were discussed with the patient.                            All questions were answered, and informed consent                            was obtained. Prior Anticoagulants: The patient has                            taken no previous anticoagulant or antiplatelet                            agents. ASA Grade Assessment: II - A patient with                            mild systemic disease. After reviewing the risks                            and benefits, the patient was deemed in                            satisfactory condition to undergo the procedure.  After obtaining informed consent, the endoscope was                            passed under direct vision. Throughout the                            procedure, the patient's blood pressure, pulse, and                            oxygen saturations were monitored continuously. The                            Endoscope was introduced through the mouth, and                            advanced  to the second part of duodenum. The upper                            GI endoscopy was accomplished without difficulty.                            The patient tolerated the procedure well. Scope In: Scope Out: Findings:                 LA Grade B (one or more mucosal breaks greater than                            5 mm, not extending between the tops of two mucosal                            folds) esophagitis with no bleeding was found in                            the lower third of the esophagus. Biopsies were                            taken with a cold forceps for histology.                            Verification of patient identification for the                            specimen was done. Estimated blood loss was minimal.                           One benign-appearing, intrinsic moderate stenosis                            was found 35 cm from the incisors. This stenosis                            measured 1.5 cm (inner diameter). The stenosis was  traversed. Biopsies were taken with a cold forceps                            for histology. Verification of patient                            identification for the specimen was done. Estimated                            blood loss was minimal. A TTS dilator was passed                            through the scope. Dilation with an 18-19-20 mm                            balloon dilator was performed to 20 mm. The                            dilation site was examined and showed moderate                            improvement in luminal narrowing. Estimated blood                            loss was minimal.                           Abnormal motility was noted in the esophagus. There                            is a decrease in motility of the esophageal body.                           A 4 cm hiatal hernia was present.                           A few diminutive erosions with no stigmata of                             recent bleeding were found in the prepyloric region                            of the stomach. Biopsies were taken with a cold                            forceps for histology. Verification of patient                            identification for the specimen was done. Estimated                            blood loss was minimal.  The exam was otherwise without abnormality.                           The cardia and gastric fundus were normal on                            retroflexion. Complications:            No immediate complications. Estimated Blood Loss:     Estimated blood loss was minimal. Impression:               - LA Grade B esophagitis with no bleeding. Biopsied.                           - Benign-appearing esophageal stenosis. Biopsied.                            Dilated.                           - Abnormal esophageal motility.                           - 4 cm hiatal hernia.                           - Erosive gastropathy with no stigmata of recent                            bleeding. Biopsied. Does use ibuprofen prn                           - The examination was otherwise normal. Recommendation:           - Patient has a contact number available for                            emergencies. The signs and symptoms of potential                            delayed complications were discussed with the                            patient. Return to normal activities tomorrow.                            Written discharge instructions were provided to the                            patient.                           - Clear liquids x 1 hour then soft foods rest of                            day. Start prior diet tomorrow.                           -  Continue present medications.                           - Await pathology results.                           - Suspect some concomitatint dysmotility - thinking                            about mano and  pH/impedance since on Dexilant 60 mg                            qd already, ? add H2B                           Triamcinolone injection possible option, also                            needle knife incision of stricture if this does not                            provide longer lasting relief of dysphagia Gatha Mayer, MD 02/16/2019 11:05:02 AM This report has been signed electronically.

## 2019-02-16 NOTE — Telephone Encounter (Signed)
lled to let pt. Know Zofran Rx was sent in to her pharmacy.

## 2019-02-16 NOTE — Progress Notes (Signed)
Called to room to assist during endoscopic procedure.  Patient ID and intended procedure confirmed with present staff. Received instructions for my participation in the procedure from the performing physician.  

## 2019-02-18 ENCOUNTER — Telehealth: Payer: Self-pay

## 2019-02-18 NOTE — Telephone Encounter (Signed)
No answer, left message to call back later today, B.Josemiguel Gries RN. 

## 2019-02-18 NOTE — Telephone Encounter (Signed)
Left message on 2nd follow up call. 

## 2019-02-20 ENCOUNTER — Encounter: Payer: Self-pay | Admitting: *Deleted

## 2019-02-20 ENCOUNTER — Other Ambulatory Visit: Payer: Self-pay | Admitting: *Deleted

## 2019-02-20 MED ORDER — FAMOTIDINE 40 MG PO TABS: 40.0000 mg | ORAL_TABLET | Freq: Every day | ORAL | 3 refills | Status: DC

## 2019-03-25 ENCOUNTER — Ambulatory Visit: Payer: 59 | Admitting: Internal Medicine

## 2019-04-03 ENCOUNTER — Encounter: Payer: Self-pay | Admitting: Internal Medicine

## 2019-04-23 ENCOUNTER — Ambulatory Visit (INDEPENDENT_AMBULATORY_CARE_PROVIDER_SITE_OTHER): Payer: 59 | Admitting: Internal Medicine

## 2019-04-23 ENCOUNTER — Encounter: Payer: Self-pay | Admitting: Internal Medicine

## 2019-04-23 VITALS — BP 120/70 | HR 80 | Temp 98.1°F | Ht 62.5 in | Wt 149.1 lb

## 2019-04-23 DIAGNOSIS — R1310 Dysphagia, unspecified: Secondary | ICD-10-CM | POA: Diagnosis not present

## 2019-04-23 DIAGNOSIS — K222 Esophageal obstruction: Secondary | ICD-10-CM

## 2019-04-23 DIAGNOSIS — K219 Gastro-esophageal reflux disease without esophagitis: Secondary | ICD-10-CM

## 2019-04-23 DIAGNOSIS — R935 Abnormal findings on diagnostic imaging of other abdominal regions, including retroperitoneum: Secondary | ICD-10-CM | POA: Diagnosis not present

## 2019-04-23 DIAGNOSIS — R1319 Other dysphagia: Secondary | ICD-10-CM

## 2019-04-23 DIAGNOSIS — K224 Dyskinesia of esophagus: Secondary | ICD-10-CM | POA: Diagnosis not present

## 2019-04-23 NOTE — Addendum Note (Signed)
Addended by: [place], Cornie [facility] E on: 04/23/2019 12:05 PM   Modules accepted: Orders

## 2019-04-23 NOTE — Patient Instructions (Signed)
You have been scheduled for an esophageal manometry test at [facility] on 05/27/2019 at 10:30AM. Please arrive 30 minutes prior to your procedure for registration. You will need to go to outpatient registration (1st floor of the hospital) first. Make certain to bring your insurance cards as well as a complete list of medications.  Please remember the following:  1) Do not take any muscle relaxants, xanax (alprazolam) or ativan for 1 day prior to your test as well as the day of the test.  2) Nothing to eat or drink after 12:00 midnight on the night before your test.  3) Hold all diabetic medications/insulin the morning of the test. You may eat and take your medications after the test.  It will take at least 2 weeks to receive the results of this test from your physician.  ------------------------------------------ ABOUT ESOPHAGEAL MANOMETRY Esophageal manometry (muh-NOM-uh-tree) is a test that gauges how well your esophagus works. Your esophagus is the long, muscular tube that connects your throat to your stomach. Esophageal manometry measures the rhythmic muscle contractions (peristalsis) that occur in your esophagus when you swallow. Esophageal manometry also measures the coordination and force exerted by the muscles of your esophagus.  During esophageal manometry, a thin, flexible tube (catheter) that contains sensors is passed through your nose, down your esophagus and into your stomach. Esophageal manometry can be helpful in diagnosing some mostly uncommon disorders that affect your esophagus.  Why it's done Esophageal manometry is used to evaluate the movement (motility) of food through the esophagus and into the stomach. The test measures how well the circular bands of muscle (sphincters) at the top and bottom of your esophagus open and close, as well as the pressure, strength and pattern of the wave of esophageal muscle contractions that moves food along.  What you can  expect Esophageal manometry is an outpatient procedure done without sedation. Most people tolerate it well. You may be asked to change into a hospital gown before the test starts.  During esophageal manometry  . While you are sitting up, a member of your health care team sprays your throat with a numbing medication or puts numbing gel in your nose or both.  . A catheter is guided through your nose into your esophagus. The catheter may be sheathed in a water-filled sleeve. It doesn't interfere with your breathing. However, your eyes may water, and you may gag. You may have a slight nosebleed from irritation.  . After the catheter is in place, you may be asked to lie on your back on an exam table, or you may be asked to remain seated.  . You then swallow small sips of water. As you do, a computer connected to the catheter records the pressure, strength and pattern of your esophageal muscle contractions.  . During the test, you'll be asked to breathe slowly and smoothly, remain as still as possible, and swallow only when you're asked to do so.  . A member of your health care team may move the catheter down into your stomach while the catheter continues its measurements.  . The catheter then is slowly withdrawn. The test usually lasts 20 to 30 minutes.  After esophageal manometry  When your esophageal manometry is complete, you may return to your normal activities  This test typically takes 30-45 minutes to complete. ________________________________________________________________________________  Due to recent COVID-19 restrictions implemented by our local and state authorities and in an effort to keep both patients and staff as safe as possible, our  hospital system now requires COVID-19 testing prior to any scheduled hospital procedure. Please go to our [facility] location drive thru testing site ([address] [place], [place] [address]) on 05/23/2019 at  11:15AM. There will be multiple  testing areas, the first checkpoint being for pre-procedure/surgery testing. Get into the right (yellow) lane that leads to the PAT testing team. You will not be billed at the time of testing but may receive a bill later depending on your insurance. The approximate cost of the test is $100. You must agree to quarantine from the time of your testing until the procedure date on 05/27/2019 . This should include staying at home with ONLY the people you live with. Avoid take-out, grocery store shopping or leaving the house for any non-emergent reason. Failure to have your COVID-19 test done on the date and time you have been scheduled will result in cancellation of procedure. Please call our office at [phone] if you have any questions.    I appreciate the opportunity to care for you. Silvano Rusk, MD, [facility]

## 2019-04-23 NOTE — Progress Notes (Signed)
Breanna Barr 64 y.o. [birth year] [id number]  Assessment & Plan:   Encounter Diagnoses  Name Primary?  . Esophageal dysphagia Yes  . GERD with stricture   . Abnormal MRI of the abdomen   . 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  She does not have an active diagnosis of a connective tissue disorder but is followed at the [facility] rheumatology clinic with fibromyalgia and osteoporosis.  Neck step is for an esophageal manometry.  Pending that consider EGD dilation with triamcinolone injection versus referral to a tertiary center for advanced therapy of esophageal stricture.  If the manometry does suggest connective tissue disorder follow-up with rheumatology on that.   Continue current GERD therapy  Records release review MRA of abdomen to try to understand what is going on there and what relationship it would have to her GI symptoms and signs.  I would say that the type of symptom complex she has is not consistent with mesenteric ischemia based upon my understanding of how that operates.  I appreciate the opportunity to care for this patient. CC: Thea Alken     Subjective:   Chief Complaint: Dysphagia, reflux, stricture  HPI Breanna Barr returns about 2 months after EGD (February 16, 2019) and repeat dilation of an esophageal stricture to 20 mm with some benefit though has had 3 episodes of impact dysphagia and regurgitation.  Continues to have spasms in her chest and heartburn symptoms despite PPI and H2 blocker.  Says she gets really good relief for a week or 2 after dilations but then the symptoms return.  When I did her EGD it did not look like the esophagus was functioning properly i.e. poor motility.  Barium swallow study in 2016 with disruption of 2 out of 4 primary esophageal peristaltic waves with proximal escape and occasional distal tertiary  contractions and reflux and slight mucosal irregularity in mid esophagus.  Tablet passed easily.  No stricture at that time.  She has had a history of intestinal metaplasia on some distal esophageal biopsies in the past but did not have that this time. Diagnosis 1. Surgical [P], gastric antrum - REACTIVE GASTROPATHY - NO H. PYLORI OR INTESTINAL METAPLASIA IDENTIFIED - SEE COMMENT 2. Surgical [P], esophageal stricture - SQUAMOCOLUMNAR JUNCTION WITH REACTIVE ATYPIA AND CHRONIC INFLAMMATION - NO INTESTINAL METAPLASIA, DYSPLASIA OR MALIGNANCY IDENTIFIED 3. Surgical [P], mid esophagus and distal esophagus - REACTIVE SQUAMOUS MUCOSA - NO INCREASED INTRAEPITHELIAL EOSINOPHILS   She also tells me that she has been followed with magnetic resonance angiograms of the abdomen and apparently has narrowing of the arteries and when she asked one of her providers in [place] if that could be causing some of her symptoms she was told yes.  I do not have those records.  She describes pain with eating not afterwards.  Not in a delayed fashion.  Allergies  Allergen Reactions  . Codeine Other (See Comments)    Rapid heart rate  . Penicillins Shortness Of Breath and Other (See Comments)    Rapid heart rate  . Prolia [Denosumab] Anaphylaxis   Current Meds  Medication Sig  . AMBULATORY NON FORMULARY MEDICATION Medication Name: GI Cocktail 90 ml 2% viscous xylocaine 90 ml dicyclomine 10 mg/61ml 270 ml Maalox 400 mg  15-30 ml qid prn  . Cholecalciferol (VITAMIN D3) 25 MCG (1000 UT) CAPS Take 4,000 Units by mouth daily.  . Coenzyme Q10 (COQ-10 PO) Take 1  tablet by mouth daily.   . cyclobenzaprine (FLEXERIL) 10 MG tablet Take 20 mg by mouth at bedtime.  Marland Kitchen dexlansoprazole (DEXILANT) 60 MG capsule Take 1 capsule (60 mg total) by mouth daily before breakfast.  . diltiazem (CARDIZEM CD) 180 MG 24 hr capsule Take 1 capsule by mouth daily.  . famotidine (PEPCID) 40 MG tablet Take 1 tablet (40 mg total) by mouth  at bedtime.  . fenofibrate micronized (LOFIBRA) 134 MG capsule Take 1 capsule by mouth daily.  . hydrochlorothiazide (HYDRODIURIL) 25 MG tablet Take 25 mg by mouth every morning.  Marland Kitchen ibuprofen (ADVIL,MOTRIN) 200 MG tablet Take 200 mg by mouth every 6 (six) hours as needed.  Marland Kitchen losartan (COZAAR) 100 MG tablet Take 100 mg by mouth daily.  . metoprolol succinate (TOPROL-XL) 100 MG 24 hr tablet Take 100 mg by mouth 2 (two) times daily. Take with or immediately following a meal.  . ondansetron (ZOFRAN-ODT) 4 MG disintegrating tablet Take 1 tablet (4 mg total) by mouth every 8 (eight) hours as needed for nausea or vomiting.  . sucralfate (CARAFATE) 1 g tablet Take 1 tablet (1 g total) by mouth 4 (four) times daily -  with meals and at bedtime. Dissolve tablet in 1 tablespoon water (Patient taking differently: Take 1 g by mouth as needed. Dissolve tablet in 1 tablespoon water)  . VITAMIN D PO Take 1 capsule by mouth daily.   Past Medical History:  Diagnosis Date  . Barrett's esophagus   . Chronic headaches   . Clostridium difficile infection   . Colitis   . Compression fracture of thoracic vertebra (HCC)    T9  . Esophageal stricture   . Fibromyalgia   . GERD (gastroesophageal reflux disease)   . H/O hiatal hernia   . Hx of adenomatous polyp of colon 07/22/2018  . Hx of Clostridium difficile infection 07/07/2018  . Hypertension   . Osteoarthritis   . Osteoporosis   . Palpitations   . Renal artery stenosis ([place])   . Rheumatoid arthritis ([place])   . Vitamin D deficiency    Past Surgical History:  Procedure Laterality Date  . ABDOMINAL HYSTERECTOMY  1989  . CARDIAC CATHETERIZATION  2013   06/07/11 [facility]) mild non-obstructive CAD, normal LV wall motion and function, EF 60-65%, normal PA pressure, mild PVD of the right iliac and right common femoral artery.  . COLONOSCOPY  Last, 2010  . ESOPHAGOGASTRODUODENOSCOPY  Multiple   With esophageal dilation  . KYPHOPLASTY  02/12/2012   Procedure:  KYPHOPLASTY;  Surgeon: Erline Levine, MD;  Location: [facility];  Service: Neurosurgery;  Laterality: N/A;  Thoracic nine Kyphoplasty  . ROTATOR CUFF REPAIR Right 1997  . TOTAL SHOULDER ARTHROPLASTY Left 2017   Social History   Social History Narrative   The patient is married. She has raised her great niece and great-nephew. One as a teenager the other is an adult. No biologic children. She is a retired Conservation officer, nature.   2-4 caffeinated beverages daily   No alcohol no tobacco. Remote smoking.   family history includes Cancer - Cervical in her mother; Esophageal cancer in her maternal uncle; Stroke in her father and mother.   Review of Systems As above  Objective:   Physical Exam BP 120/70 (BP Location: Left Arm, Patient Position: Sitting, Cuff Size: Normal)   Pulse 80   Temp 98.1 F (36.7 C)   Ht 5' 2.5" (1.588 m)   Wt 149 lb 2 oz (67.6 kg)   BMI 26.84 kg/m

## 2019-05-01 ENCOUNTER — Telehealth: Payer: Self-pay | Admitting: Internal Medicine

## 2019-05-01 DIAGNOSIS — I774 Celiac artery compression syndrome: Secondary | ICD-10-CM

## 2019-05-01 DIAGNOSIS — I771 Stricture of artery: Secondary | ICD-10-CM

## 2019-05-01 DIAGNOSIS — I701 Atherosclerosis of renal artery: Secondary | ICD-10-CM

## 2019-05-01 NOTE — Telephone Encounter (Signed)
amb referral placed to Vein and vascular.  Records from [facility] sent to Stat scan

## 2019-05-01 NOTE — Telephone Encounter (Signed)
I spoke to Breanna Barr about her 2020 and 2021 magnetic resonance angiography tests.  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  It was poorly visualized.  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  I think I am missing some of the other report on that 1.  I am working her up for esophageal problems but she is having some abdominal pain as well.  She is telling me no intervention has been recommended on her renal artery and after discussing it with her she is interested in an opinion from a vascular surgeon here in [place].   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  She is aware she might not hear back about this until next week.  I have asked her to get imaging disks to bring to her appointment with [facility] as well.

## 2019-05-23 ENCOUNTER — Other Ambulatory Visit (HOSPITAL_COMMUNITY)
Admission: RE | Admit: 2019-05-23 | Discharge: 2019-05-23 | Disposition: A | Discharge status: Home / Self Care | Payer: 59 | Source: Ambulatory Visit | Attending: Internal Medicine | Admitting: Internal Medicine

## 2019-05-23 DIAGNOSIS — Z01812 Encounter for preprocedural laboratory examination: Secondary | ICD-10-CM | POA: Diagnosis not present

## 2019-05-23 DIAGNOSIS — Z20822 Contact with and (suspected) exposure to covid-19: Secondary | ICD-10-CM | POA: Diagnosis not present

## 2019-05-23 LAB — SARS CORONAVIRUS 2 (TAT 6-24 HRS): SARS Coronavirus 2: NEGATIVE

## 2019-05-27 ENCOUNTER — Encounter (HOSPITAL_COMMUNITY)
Admission: RE | Disposition: A | Discharge status: Home / Self Care | Payer: Self-pay | Source: Home / Self Care | Attending: Internal Medicine

## 2019-05-27 ENCOUNTER — Ambulatory Visit (HOSPITAL_COMMUNITY)
Admission: RE | Admit: 2019-05-27 | Discharge: 2019-05-27 | Disposition: A | Discharge status: Home / Self Care | Payer: 59 | Attending: Internal Medicine | Admitting: Internal Medicine

## 2019-05-27 DIAGNOSIS — R1310 Dysphagia, unspecified: Secondary | ICD-10-CM | POA: Diagnosis present

## 2019-05-27 DIAGNOSIS — R079 Chest pain, unspecified: Secondary | ICD-10-CM | POA: Insufficient documentation

## 2019-05-27 DIAGNOSIS — R1110 Vomiting, unspecified: Secondary | ICD-10-CM | POA: Insufficient documentation

## 2019-05-27 DIAGNOSIS — K219 Gastro-esophageal reflux disease without esophagitis: Secondary | ICD-10-CM

## 2019-05-27 HISTORY — PX: ESOPHAGEAL MANOMETRY: SHX5429

## 2019-05-27 SURGERY — MANOMETRY, ESOPHAGUS

## 2019-05-27 MED ORDER — LIDOCAINE VISCOUS HCL 2 % MT SOLN
OROMUCOSAL | Status: AC
  Filled 2019-05-27: qty 15

## 2019-05-27 SURGICAL SUPPLY — 2 items
FACESHIELD LNG OPTICON STERILE (SAFETY) IMPLANT
GLOVE BIO SURGEON STRL SZ8 (GLOVE) ×6 IMPLANT

## 2019-05-27 NOTE — Progress Notes (Signed)
Esophageal manometry performed per protocol without complications.  Patient tolerated well. 

## 2019-05-29 ENCOUNTER — Encounter: Payer: Self-pay | Admitting: *Deleted

## 2019-06-05 ENCOUNTER — Other Ambulatory Visit: Payer: Self-pay | Admitting: Gastroenterology

## 2019-06-05 DIAGNOSIS — K222 Esophageal obstruction: Secondary | ICD-10-CM

## 2019-06-11 ENCOUNTER — Other Ambulatory Visit (HOSPITAL_COMMUNITY): Payer: 59

## 2019-06-11 ENCOUNTER — Other Ambulatory Visit: Payer: Self-pay | Admitting: *Deleted

## 2019-06-11 DIAGNOSIS — I701 Atherosclerosis of renal artery: Secondary | ICD-10-CM

## 2019-06-12 ENCOUNTER — Other Ambulatory Visit (HOSPITAL_COMMUNITY)
Admission: RE | Admit: 2019-06-12 | Discharge: 2019-06-12 | Disposition: A | Discharge status: Home / Self Care | Payer: 59 | Source: Ambulatory Visit | Attending: Gastroenterology | Admitting: Gastroenterology

## 2019-06-12 DIAGNOSIS — Z01812 Encounter for preprocedural laboratory examination: Secondary | ICD-10-CM | POA: Diagnosis present

## 2019-06-12 DIAGNOSIS — R1310 Dysphagia, unspecified: Secondary | ICD-10-CM

## 2019-06-12 DIAGNOSIS — Z20822 Contact with and (suspected) exposure to covid-19: Secondary | ICD-10-CM | POA: Insufficient documentation

## 2019-06-12 LAB — SARS CORONAVIRUS 2 (TAT 6-24 HRS): SARS Coronavirus 2: NEGATIVE

## 2019-06-16 ENCOUNTER — Other Ambulatory Visit: Payer: Self-pay

## 2019-06-16 ENCOUNTER — Ambulatory Visit (HOSPITAL_COMMUNITY): Payer: 59 | Admitting: Anesthesiology

## 2019-06-16 ENCOUNTER — Ambulatory Visit (HOSPITAL_COMMUNITY)
Admission: RE | Admit: 2019-06-16 | Discharge: 2019-06-16 | Disposition: A | Discharge status: Home / Self Care | Payer: 59 | Attending: Gastroenterology | Admitting: Gastroenterology

## 2019-06-16 ENCOUNTER — Encounter (HOSPITAL_COMMUNITY): Payer: Self-pay | Admitting: Gastroenterology

## 2019-06-16 ENCOUNTER — Encounter (HOSPITAL_COMMUNITY)
Admission: RE | Disposition: A | Discharge status: Home / Self Care | Payer: Self-pay | Source: Home / Self Care | Attending: Gastroenterology

## 2019-06-16 DIAGNOSIS — I701 Atherosclerosis of renal artery: Secondary | ICD-10-CM | POA: Insufficient documentation

## 2019-06-16 DIAGNOSIS — I10 Essential (primary) hypertension: Secondary | ICD-10-CM | POA: Insufficient documentation

## 2019-06-16 DIAGNOSIS — Z8719 Personal history of other diseases of the digestive system: Secondary | ICD-10-CM | POA: Diagnosis not present

## 2019-06-16 DIAGNOSIS — K2990 Gastroduodenitis, unspecified, without bleeding: Secondary | ICD-10-CM

## 2019-06-16 DIAGNOSIS — K2970 Gastritis, unspecified, without bleeding: Secondary | ICD-10-CM | POA: Diagnosis not present

## 2019-06-16 DIAGNOSIS — K259 Gastric ulcer, unspecified as acute or chronic, without hemorrhage or perforation: Secondary | ICD-10-CM | POA: Insufficient documentation

## 2019-06-16 DIAGNOSIS — M069 Rheumatoid arthritis, unspecified: Secondary | ICD-10-CM | POA: Insufficient documentation

## 2019-06-16 DIAGNOSIS — K449 Diaphragmatic hernia without obstruction or gangrene: Secondary | ICD-10-CM

## 2019-06-16 DIAGNOSIS — K2950 Unspecified chronic gastritis without bleeding: Secondary | ICD-10-CM | POA: Insufficient documentation

## 2019-06-16 DIAGNOSIS — K219 Gastro-esophageal reflux disease without esophagitis: Secondary | ICD-10-CM | POA: Insufficient documentation

## 2019-06-16 DIAGNOSIS — K222 Esophageal obstruction: Secondary | ICD-10-CM | POA: Diagnosis not present

## 2019-06-16 DIAGNOSIS — M797 Fibromyalgia: Secondary | ICD-10-CM | POA: Diagnosis not present

## 2019-06-16 DIAGNOSIS — Z86010 Personal history of colonic polyps: Secondary | ICD-10-CM | POA: Insufficient documentation

## 2019-06-16 DIAGNOSIS — R1310 Dysphagia, unspecified: Secondary | ICD-10-CM | POA: Diagnosis present

## 2019-06-16 HISTORY — PX: BALLOON DILATION: SHX5330

## 2019-06-16 HISTORY — PX: ESOPHAGOGASTRODUODENOSCOPY (EGD) WITH PROPOFOL: SHX5813

## 2019-06-16 HISTORY — PX: BIOPSY: SHX5522

## 2019-06-16 HISTORY — PX: KENALOG INJECTION: SHX5298

## 2019-06-16 SURGERY — ESOPHAGOGASTRODUODENOSCOPY (EGD) WITH PROPOFOL
Anesthesia: Monitor Anesthesia Care

## 2019-06-16 MED ORDER — LIDOCAINE 2% (20 MG/ML) 5 ML SYRINGE
INTRAMUSCULAR | Status: DC | PRN
  Administered 2019-06-16: 100 mg via INTRAVENOUS

## 2019-06-16 MED ORDER — PROPOFOL 500 MG/50ML IV EMUL
INTRAVENOUS | Status: AC
  Filled 2019-06-16: qty 50

## 2019-06-16 MED ORDER — PROPOFOL 500 MG/50ML IV EMUL
INTRAVENOUS | Status: DC | PRN
  Administered 2019-06-16: 100 ug/kg/min via INTRAVENOUS

## 2019-06-16 MED ORDER — TRIAMCINOLONE ACETONIDE 40 MG/ML IJ SUSP
INTRAMUSCULAR | Status: AC
  Filled 2019-06-16: qty 1

## 2019-06-16 MED ORDER — SODIUM CHLORIDE (PF) 0.9 % IJ SOLN
INTRAMUSCULAR | Status: AC
  Filled 2019-06-16: qty 10

## 2019-06-16 MED ORDER — TRIAMCINOLONE ACETONIDE 40 MG/ML IJ SUSP
INTRAMUSCULAR | Status: DC | PRN
  Administered 2019-06-16: 1 mL

## 2019-06-16 MED ORDER — LACTATED RINGERS IV SOLN: INTRAVENOUS | Status: DC

## 2019-06-16 MED ORDER — PROPOFOL 10 MG/ML IV BOLUS
INTRAVENOUS | Status: DC | PRN
  Administered 2019-06-16 (×3): 20 mg via INTRAVENOUS

## 2019-06-16 SURGICAL SUPPLY — 15 items

## 2019-06-16 NOTE — Interval H&P Note (Signed)
History and Physical Interval Note:  06/16/2019 10:08 AM  Breanna Barr  has presented today for surgery, with the diagnosis of dysphagia with stricture.  The various methods of treatment have been discussed with the patient and family. After consideration of risks, benefits and other options for treatment, the patient has consented to  Procedure(s) with comments: ESOPHAGOGASTRODUODENOSCOPY (EGD) WITH PROPOFOL (N/A) - Will need Triamcinolone injections BALLOON DILATION (N/A) - will need triamcinolone injection KENALOG INJECTION as a surgical intervention.  The patient's history has been reviewed, patient examined, no change in status, stable for surgery.  I have reviewed the patient's chart and labs.  Questions were answered to the patient's satisfaction.     Dominic Pea Breanna Barr

## 2019-06-16 NOTE — Op Note (Signed)
[facility] Patient Name: Breanna Barr Procedure Date: 06/16/2019 MRN: [id number] Attending MD: Gerrit Heck , MD Date of Birth: 12/23/1955 CSN: [id number] Age: 64 Admit Type: Outpatient Procedure:                Upper GI endoscopy Indications:              Dysphagia, Esophageal reflux, For therapy of                            esophageal stricture                           Recurrent esophageal stricture, with most recet EGD                            in 02/2019 with successful 20 mm TTS dilation, but                            now return of index dysphagia. EM was normal. Also                            with ongoing breakthrough reflux sxs (HB) despite                            dexlansoprazole and famotidine. Providers:                Gerrit Heck, MD, Josie Dixon, RN, Cherylynn Ridges, Technician, Danley Danker, CRNA Referring MD:             Gatha Mayer, MD Medicines:                Monitored Anesthesia Care Complications:            No immediate complications. Estimated Blood Loss:     Estimated blood loss was minimal. Procedure:                Pre-Anesthesia Assessment:                           - Prior to the procedure, a History and Physical                            was performed, and patient medications and                            allergies were reviewed. The patient's tolerance of                            previous anesthesia was also reviewed. The risks                            and benefits of the procedure and the sedation  options and risks were discussed with the patient.                            All questions were answered, and informed consent                            was obtained. Prior Anticoagulants: The patient has                            taken no previous anticoagulant or antiplatelet                            agents. ASA Grade Assessment: II - A patient with                  mild systemic disease. After reviewing the risks                            and benefits, the patient was deemed in                            satisfactory condition to undergo the procedure.                           After obtaining informed consent, the endoscope was                            passed under direct vision. Throughout the                            procedure, the patient's blood pressure, pulse, and                            oxygen saturations were monitored continuously. The                            [id number] Olympus gastroscope was                            introduced through the mouth, and advanced to the                            second part of duodenum. The upper GI endoscopy was                            accomplished without difficulty. The patient                            tolerated the procedure well. Scope In: Scope Out: Findings:      One benign-appearing, intrinsic moderate stenosis was found 34 cm from       the incisors. This stenosis measured 1.4 cm (inner diameter). The       stenosis was traversed. A TTS dilator was passed through the scope.       Dilation with an 18-19-20 mm balloon  dilator was performed to 18 mm. The       dilation site was examined and showed moderate mucosal disruption x2 and       moderate improvement in luminal narrowing, consistent with successful       dilation. The area was then successfully injected with triamcinolone (40       mg/mL diluted into 4 cc) in 0.5 cc aliquots. Finally, cold forceps were       then used for further fracturing of the stricture. Estimated blood loss       was minimal.      Esophagogastric landmarks were identified: the Z-line was found at 34       cm, the gastroesophageal junction was found at 34 cm and the site of       hiatal narrowing was found at 38 cm from the incisors. The previously       noted LA Grade B esophagitis has since healed.      A 4 cm hiatal hernia was  present.      The gastroesophageal flap valve was visualized endoscopically and       classified as Hill Grade IV (no fold, wide open lumen, hiatal hernia       present).      Localized mild inflammation characterized by erythema and shallow       ulcerations was found in the gastric antrum and in the prepyloric region       of the stomach. Biopsies were taken with a cold forceps for Helicobacter       pylori testing. Estimated blood loss was minimal.      The gastric fundus and gastric body were normal. Biopsies were taken       with a cold forceps for Helicobacter pylori testing. Estimated blood       loss was minimal.      The duodenal bulb, first portion of the duodenum and second portion of       the duodenum were normal. Impression:               - Benign-appearing esophageal stenosis. Dilated                            with 18 mm TTS balloon then injected with Kenalog                            and the ring further fractured with cold forceps.                           - Esophagogastric landmarks identified.                           - 4 cm hiatal hernia.                           - Gastroesophageal flap valve classified as Hill                            Grade IV (no fold, wide open lumen, hiatal hernia                            present).                           -  Gastritis. Biopsied.                           - Normal gastric fundus and gastric body. Biopsied.                           - Normal duodenal bulb, first portion of the                            duodenum and second portion of the duodenum. Moderate Sedation:      Not Applicable - Patient had care per Anesthesia. Recommendation:           - Patient has a contact number available for                            emergencies. The signs and symptoms of potential                            delayed complications were discussed with the                            patient. Return to normal activities tomorrow.                             Written discharge instructions were provided to the                            patient.                           - Full liquid diet today.                           - Continue present medications.                           - Await pathology results.                           - Repeat upper endoscopy PRN for retreatment.                           - Follow-up with Dr. Carlean Purl in the GI office at                            appointment to be scheduled.                           - If ongoing breakthrough reflux symptoms despite                            PPI and H2RA, consider further evaluation with                            ambulatory pH and impedance monitoring, and if  positive, could consider hiatal hernia repair and                            atireflux surgery for treatment of reflux along                            with suspected recurrent peptic stricture. Procedure Code(s):        --- Professional ---                           [phone], Esophagogastroduodenoscopy, flexible,                            transoral; with transendoscopic balloon dilation of                            esophagus (less than 30 mm diameter)                           43239, 59, Esophagogastroduodenoscopy, flexible,                            transoral; with biopsy, single or multiple Diagnosis Code(s):        --- Professional ---                           K22.2, Esophageal obstruction                           K44.9, Diaphragmatic hernia without obstruction or                            gangrene                           K29.70, Gastritis, unspecified, without bleeding                           R13.10, Dysphagia, unspecified                           K21.9, Gastro-esophageal reflux disease without                            esophagitis CPT copyright 2019 American Medical Association. All rights reserved. The codes documented in this report are preliminary and upon coder  review may  be revised to meet current compliance requirements. Gerrit Heck, MD 06/16/2019 10:47:33 AM Number of Addenda: 0

## 2019-06-16 NOTE — Discharge Instructions (Signed)
YOU HAD AN ENDOSCOPIC PROCEDURE TODAY: Refer to the procedure report and other information in the discharge instructions given to you for any specific questions about what was found during the examination. If this information does not answer your questions, please call [place] at [phone] to clarify.   YOU SHOULD EXPECT: Some feelings of bloating in the abdomen. Passage of more gas than usual. Walking can help get rid of the air that was put into your GI tract during the procedure and reduce the bloating. If you had a lower endoscopy (such as a colonoscopy or flexible sigmoidoscopy) you may notice spotting of blood in your stool or on the toilet paper. Some abdominal soreness may be present for a day or two, also.  DIET: Your first meal following the procedure should be a light meal and then it is ok to progress to your normal diet. A half-sandwich or bowl of soup is an example of a good first meal. Heavy or fried foods are harder to digest and may make you feel nauseous or bloated. Drink plenty of fluids but you should avoid alcoholic beverages for 24 hours. If you had an esophageal dilation, please see attached information for diet.   ACTIVITY: Your care partner should take you home directly after the procedure. You should plan to take it easy, moving slowly for the rest of the day. You can resume normal activity the day after the procedure however YOU SHOULD NOT DRIVE, use power tools, machinery or perform tasks that involve climbing or major physical exertion for 24 hours (because of the sedation medicines used during the test).   SYMPTOMS TO REPORT IMMEDIATELY: A gastroenterologist can be reached at any hour. Please call [phone]  for any of the following symptoms:  Following lower endoscopy (colonoscopy, flexible sigmoidoscopy) Excessive amounts of blood in the stool  Significant tenderness, worsening of abdominal pains  Swelling of the abdomen that is new, acute  Fever of  100 or higher  Following upper endoscopy (EGD, EUS, ERCP, esophageal dilation) Vomiting of blood or coffee ground material  New, significant abdominal pain  New, significant chest pain or pain under the shoulder blades  Painful or persistently difficult swallowing  New shortness of breath  Black, tarry-looking or red, bloody stools  FOLLOW UP:  If any biopsies were taken you will be contacted by phone or by letter within the next 1-3 weeks. Call [phone]  if you have not heard about the biopsies in 3 weeks.  Please also call with any specific questions about appointments or follow up tests. YOU HAD AN ENDOSCOPIC PROCEDURE TODAY: Refer to the procedure report and other information in the discharge instructions given to you for any specific questions about what was found during the examination. If this information does not answer your questions, please call [place] office at [phone] to clarify.   YOU SHOULD EXPECT: Some feelings of bloating in the abdomen. Passage of more gas than usual. Walking can help get rid of the air that was put into your GI tract during the procedure and reduce the bloating. If you had a lower endoscopy (such as a colonoscopy or flexible sigmoidoscopy) you may notice spotting of blood in your stool or on the toilet paper. Some abdominal soreness may be present for a day or two, also.  DIET: Your first meal following the procedure should be a light meal and then it is ok to progress to your normal diet. A half-sandwich or bowl of soup is an example of  a good first meal. Heavy or fried foods are harder to digest and may make you feel nauseous or bloated. Drink plenty of fluids but you should avoid alcoholic beverages for 24 hours. If you had a esophageal dilation, please see attached instructions for diet.    ACTIVITY: Your care partner should take you home directly after the procedure. You should plan to take it easy, moving slowly for the rest of the day. You can  resume normal activity the day after the procedure however YOU SHOULD NOT DRIVE, use power tools, machinery or perform tasks that involve climbing or major physical exertion for 24 hours (because of the sedation medicines used during the test).   SYMPTOMS TO REPORT IMMEDIATELY: A gastroenterologist can be reached at any hour. Please call [phone]  for any of the following symptoms:  Following lower endoscopy (colonoscopy, flexible sigmoidoscopy) Excessive amounts of blood in the stool  Significant tenderness, worsening of abdominal pains  Swelling of the abdomen that is new, acute  Fever of 100 or higher  Following upper endoscopy (EGD, EUS, ERCP, esophageal dilation) Vomiting of blood or coffee ground material  New, significant abdominal pain  New, significant chest pain or pain under the shoulder blades  Painful or persistently difficult swallowing  New shortness of breath  Black, tarry-looking or red, bloody stools  FOLLOW UP:  If any biopsies were taken you will be contacted by phone or by letter within the next 1-3 weeks. Call [phone]  if you have not heard about the biopsies in 3 weeks.  Please also call with any specific questions about appointments or follow up tests.

## 2019-06-16 NOTE — H&P (Signed)
    P  Chief Complaint:   Dysphagia   HPI:     Patient is a 64 y.o. female presenting to [facility] for repeat EGD for known lower esophageal stricture.  Last EGD was 02/16/2019 with repeat dilation of esophageal stricture to 20 mm with initial clinical benefit, but has since had recurrence of dysphagia and 3 episodes of impaction with subsequent regurgitation (no need for emergent endoscopy with those episodes).  Esophageal manometry last month was otherwise normal.  Additionally, longstanding history of GERD, with continued heartburn and regurgitation despite Dexilant and Pepcid.  Barium swallow study in 2016 with disruption of 2 out of 4 primary esophageal peristaltic waves with proximal escape and occasional distal tertiary contractions and reflux and slight mucosal irregularity in mid esophagus.  Tablet passed easily.  No stricture at that time.  She has had a history of intestinal metaplasia on some distal esophageal biopsies in the past but did not have that during most recent EGD. Diagnosis 1. Surgical [P], gastric antrum - REACTIVE GASTROPATHY - NO H. PYLORI OR INTESTINAL METAPLASIA IDENTIFIED - SEE COMMENT 2. Surgical [P], esophageal stricture - SQUAMOCOLUMNAR JUNCTION WITH REACTIVE ATYPIA AND CHRONIC INFLAMMATION - NO INTESTINAL METAPLASIA, DYSPLASIA OR MALIGNANCY IDENTIFIED 3. Surgical [P], mid esophagus and distal esophagus - REACTIVE SQUAMOUS MUCOSA - NO INCREASED INTRAEPITHELIAL EOSINOPHILS    Review of systems:     No chest pain, no SOB, no fevers, no urinary sx   Past Medical History:  Diagnosis Date  . Barrett's esophagus   . Chronic headaches   . Clostridium difficile infection   . Colitis   . Compression fracture of thoracic vertebra (HCC)    T9  . Esophageal stricture   . Fibromyalgia   . GERD (gastroesophageal reflux disease)   . H/O hiatal hernia   . Hx of adenomatous polyp of colon 07/22/2018  . Hx of Clostridium difficile  infection 07/07/2018  . Hypertension   . Osteoarthritis   . Osteoporosis   . Palpitations   . Renal artery stenosis ([place])   . Rheumatoid arthritis ([place])   . Vitamin D deficiency     Patient's surgical history, family medical history, social history, medications and allergies were all reviewed in Epic    Current Facility-Administered Medications  Medication Dose Route Frequency Provider Last Rate Last Admin  . lactated ringers infusion   Intravenous Continuous [place] Guitron V, DO 10 mL/hr at 06/16/19 0905 New Bag at 06/16/19 0905    Physical Exam:     BP (!) 150/59   Pulse 67   Temp 98.4 F (36.9 C) (Oral)   Resp 14   Ht 5\' 2"  (1.575 m)   Wt 65.3 kg   SpO2 100%   BMI 26.34 kg/m   GENERAL:  Pleasant female in NAD PSYCH: : Cooperative, normal affect EENT:  conjunctiva pink, mucous membranes moist, neck supple without masses CARDIAC:  RRR, no murmur heard, no peripheral edema PULM: Normal respiratory effort, lungs CTA bilaterally, no wheezing ABDOMEN:  Nondistended, soft, nontender. No obvious masses, no hepatomegaly,  normal bowel sounds SKIN:  turgor, no lesions seen Musculoskeletal:  Normal muscle tone, normal strength NEURO: Alert and oriented x 3, no focal neurologic deficits   IMPRESSION and PLAN:    1) Dysphagia 2) Esophageal stricture 3) GERD  -EGD with esophageal dilation and probable Kenalog injection today          Moncerrath Berhe V Dammon Makarewicz ,DO, FACG 06/16/2019, 10:04 AM

## 2019-06-16 NOTE — Anesthesia Postprocedure Evaluation (Signed)
Anesthesia Post Note  Patient: Breanna Barr  Procedure(s) Performed: ESOPHAGOGASTRODUODENOSCOPY (EGD) WITH PROPOFOL (N/A ) BALLOON DILATION (N/A ) KENALOG INJECTION BIOPSY     Patient location during evaluation: Endoscopy Anesthesia Type: MAC Level of consciousness: awake Pain management: pain level controlled Vital Signs Assessment: post-procedure vital signs reviewed and stable Respiratory status: spontaneous breathing Cardiovascular status: stable Postop Assessment: no apparent nausea or vomiting Anesthetic complications: no    Last Vitals:  Vitals:   06/16/19 1040 06/16/19 1050  BP: (!) 114/55 (!) 132/55  Pulse: 73 63  Resp: 15 17  Temp:    SpO2: 100% 100%    Last Pain:  Vitals:   06/16/19 1050  TempSrc:   PainSc: 0-No pain   Pain Goal:                   Huston Foley

## 2019-06-16 NOTE — Anesthesia Procedure Notes (Signed)
Date/Time: 06/16/2019 10:03 AM Performed by: Sharlette Dense, CRNA Oxygen Delivery Method: Simple face mask

## 2019-06-16 NOTE — Transfer of Care (Signed)
Immediate Anesthesia Transfer of Care Note  Patient: Breanna Barr  Procedure(s) Performed: ESOPHAGOGASTRODUODENOSCOPY (EGD) WITH PROPOFOL (N/A ) BALLOON DILATION (N/A ) KENALOG INJECTION BIOPSY  Patient Location: Endoscopy Unit  Anesthesia Type:MAC  Level of Consciousness: drowsy  Airway & Oxygen Therapy: Patient Spontanous Breathing and Patient connected to face mask oxygen  Post-op Assessment: Report given to RN and Post -op Vital signs reviewed and stable  Post vital signs: Reviewed and stable  Last Vitals:  Vitals Value Taken Time  BP    Temp    Pulse    Resp    SpO2      Last Pain:  Vitals:   06/16/19 0838  TempSrc: Oral  PainSc: 4          Complications: No apparent anesthesia complications

## 2019-06-16 NOTE — Anesthesia Preprocedure Evaluation (Signed)
Anesthesia Evaluation  Patient identified by MRN, date of birth, ID band Patient awake    Reviewed: Allergy & Precautions, NPO status , Patient's Chart, lab work & pertinent test results, reviewed documented beta blocker date and time   Airway Mallampati: I       Dental no notable dental hx. (+) Teeth Intact   Pulmonary former smoker,    Pulmonary exam normal breath sounds clear to auscultation       Cardiovascular hypertension, Pt. on medications and Pt. on home beta blockers Normal cardiovascular exam Rhythm:Regular Rate:Normal     Neuro/Psych  Headaches, negative psych ROS   GI/Hepatic Neg liver ROS, GERD  Medicated,  Endo/Other  negative endocrine ROS  Renal/GU negative Renal ROS  negative genitourinary   Musculoskeletal  (+) Arthritis , Osteoarthritis,    Abdominal Normal abdominal exam  (+)   Peds  Hematology negative hematology ROS (+)   Anesthesia Other Findings   Reproductive/Obstetrics                             Anesthesia Physical Anesthesia Plan  ASA: II  Anesthesia Plan: MAC   Post-op Pain Management:    Induction:   PONV Risk Score and Plan: Propofol infusion and TIVA  Airway Management Planned:   Additional Equipment: None  Intra-op Plan:   Post-operative Plan:   Informed Consent: I have reviewed the patients History and Physical, chart, labs and discussed the procedure including the risks, benefits and alternatives for the proposed anesthesia with the patient or authorized representative who has indicated his/her understanding and acceptance.     Dental advisory given  Plan Discussed with: CRNA  Anesthesia Plan Comments:         Anesthesia Quick Evaluation

## 2019-06-17 ENCOUNTER — Ambulatory Visit (INDEPENDENT_AMBULATORY_CARE_PROVIDER_SITE_OTHER): Payer: 59 | Admitting: Vascular Surgery

## 2019-06-17 ENCOUNTER — Encounter (HOSPITAL_COMMUNITY): Payer: Self-pay

## 2019-06-17 ENCOUNTER — Ambulatory Visit (HOSPITAL_COMMUNITY)
Admission: RE | Admit: 2019-06-17 | Discharge: 2019-06-17 | Disposition: A | Discharge status: Home / Self Care | Payer: 59 | Source: Ambulatory Visit | Attending: Vascular Surgery | Admitting: Vascular Surgery

## 2019-06-17 ENCOUNTER — Encounter: Payer: Self-pay | Admitting: Vascular Surgery

## 2019-06-17 ENCOUNTER — Other Ambulatory Visit: Payer: Self-pay

## 2019-06-17 ENCOUNTER — Encounter: Payer: Self-pay | Admitting: Gastroenterology

## 2019-06-17 VITALS — BP 152/74 | HR 70 | Temp 97.3°F | Resp 20 | Ht 62.0 in | Wt 145.0 lb

## 2019-06-17 DIAGNOSIS — I701 Atherosclerosis of renal artery: Secondary | ICD-10-CM

## 2019-06-17 DIAGNOSIS — I774 Celiac artery compression syndrome: Secondary | ICD-10-CM

## 2019-06-17 DIAGNOSIS — I771 Stricture of artery: Secondary | ICD-10-CM

## 2019-06-17 LAB — SURGICAL PATHOLOGY

## 2019-06-17 NOTE — Progress Notes (Signed)
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  The consult is requested by Dr. Silvano Rusk.  ASSESSMENT & PLAN:   RIGHT RENAL ARTERY STENOSIS: The patient has a moderate right renal artery stenosis by MRA.  Her blood pressure is currently well controlled on Norvasc and beta-blocker.  I explained to her that the data for renal artery angioplasty suggest that there is really not much long-term benefit for aggressively addressing renal artery stenoses with angioplasty and stenting.  In addition given that the kidney is small on the right she is even less likely to benefit from angioplasty and stenting.  She is not a smoker.  She is on aspirin and her blood pressure has been under reasonable control.  I would only consider arteriography if her blood pressure became more difficult to control.  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  The superior mesenteric artery is widely patent on her most recent MRA.  She does not have weight loss.  She does occasionally get postprandial abdominal pain which occurs immediately after eating.  Typically pain associated with mesenteric ischemia occurs approximately 20 minutes after eating and results in the fear of food and weight loss.  Thus I am not convinced that her symptoms can be attributed to her moderate celiac artery stenosis.  Again I would not recommend an aggressive approach with arteriography and possible intervention and less her symptoms became more convincing.  I will be happy to see her back at any time if her symptoms worsen.  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  She had a recent carotid duplex scan within the last year she states that showed only mild disease bilaterally.  Her carotid disease is followed by her cardiologist.   Breanna Mayo, MD Office: [phone]   HPI:   Breanna Barr is a pleasant 64 y.o. female, who was  referred with a right renal artery stenosis and a stenosis of the celiac artery.  On my history the patient's blood pressure has been fairly well controlled on Norvasc and a beta-blocker.  She denies any history of headaches or hematuria.  She does describe some abdominal pain that sometimes occurs without eating and sometimes occurs right after she eats.  She denies any weight loss.  She describes some occasional chest pain but has undergone cardiac work-up which was fairly unremarkable.  Risk factors for vascular disease include hypertension, elevated triglycerides, a family history of premature cardiovascular disease, and remote history of tobacco use.  I do not get any history of claudication or rest pain.  She denies any history of stroke, TIAs, expressive or receptive aphasia, or amaurosis fugax.  Past Medical History:  Diagnosis Date  . Barrett's esophagus   . Chronic headaches   . Clostridium difficile infection   . Colitis   . Compression fracture of thoracic vertebra (HCC)    T9  . Esophageal stricture   . Fibromyalgia   . GERD (gastroesophageal reflux disease)   . H/O hiatal hernia   . Hx of adenomatous polyp of colon 07/22/2018  . Hx of Clostridium difficile infection 07/07/2018  . Hypertension   . Osteoarthritis   . Osteoporosis   . Palpitations   . Renal artery stenosis ([place])   . Rheumatoid arthritis ([place])   . Vitamin D deficiency     Family History  Problem Relation Age of Onset  . Cancer - Cervical Mother   .  Stroke Mother   . Stroke Father   . Esophageal cancer Maternal Uncle   . Colon polyps Neg Hx   . Colon cancer Neg Hx   . Rectal cancer Neg Hx   . Stomach cancer Neg Hx     SOCIAL HISTORY: Social History   Socioeconomic History  . Marital status: Married    Spouse name: Not on file  . Number of children: 0  . Years of education: Not on file  . Highest education level: Not on file  Occupational History  . Occupation: retired  Tobacco Use  .  Smoking status: Former Smoker    Quit date: 02/11/1984    Years since quitting: 35.3  . Smokeless tobacco: Never Used  Substance and Sexual Activity  . Alcohol use: No    Alcohol/week: 0.0 standard drinks  . Drug use: No  . Sexual activity: Not on file  Other Topics Concern  . Not on file  Social History Narrative   The patient is married. She has raised her great niece and great-nephew. One as a teenager the other is an adult. No biologic children. She is a retired Conservation officer, nature.   2-4 caffeinated beverages daily   No alcohol no tobacco. Remote smoking.   Social Determinants of Health   Financial Resource Strain:   . Difficulty of Paying Living Expenses:   Food Insecurity:   . Worried About Charity fundraiser in the Last Year:   . Arboriculturist in the Last Year:   Transportation Needs:   . Film/video editor (Medical):   Marland Kitchen Lack of Transportation (Non-Medical):   Physical Activity:   . Days of Exercise per Week:   . Minutes of Exercise per Session:   Stress:   . Feeling of Stress :   Social Connections:   . Frequency of Communication with Friends and Family:   . Frequency of Social Gatherings with Friends and Family:   . Attends Religious Services:   . Active Member of Clubs or Organizations:   . Attends Archivist Meetings:   Marland Kitchen Marital Status:   Intimate Partner Violence:   . Fear of Current or Ex-Partner:   . Emotionally Abused:   Marland Kitchen Physically Abused:   . Sexually Abused:     Allergies  Allergen Reactions  . Codeine Other (See Comments)    Rapid heart rate  . Penicillins Shortness Of Breath and Other (See Comments)    Rapid heart rate  . Prolia [Denosumab] Anaphylaxis    Current Outpatient Medications  Medication Sig Dispense Refill  . amLODipine (NORVASC) 10 MG tablet Take 10 mg by mouth at bedtime.    Marland Kitchen aspirin EC 81 MG tablet Take 81 mg by mouth daily.    . chlorthalidone (HYGROTON) 25 MG tablet Take 25 mg by mouth daily.      . Cholecalciferol (VITAMIN D3) 25 MCG (1000 UT) CAPS Take 4,000 Units by mouth daily.    . Coenzyme Q10 (COQ-10) 100 MG CAPS Take 100 mg by mouth daily.     Marland Kitchen dexlansoprazole (DEXILANT) 60 MG capsule Take 1 capsule (60 mg total) by mouth daily before breakfast. 30 capsule   . fenofibrate micronized (LOFIBRA) 134 MG capsule Take 134 mg by mouth at bedtime.   3  . losartan (COZAAR) 100 MG tablet Take 100 mg by mouth daily.    . metoprolol succinate (TOPROL-XL) 100 MG 24 hr tablet Take 100 mg by mouth 2 (two) times daily. Take with or  immediately following a meal.    . ondansetron (ZOFRAN-ODT) 4 MG disintegrating tablet Take 1 tablet (4 mg total) by mouth every 8 (eight) hours as needed for nausea or vomiting. 20 tablet 1  . famotidine (PEPCID) 40 MG tablet Take 1 tablet (40 mg total) by mouth at bedtime. (Patient taking differently: Take 40 mg by mouth daily. ) 90 tablet 3   No current facility-administered medications for this visit.    REVIEW OF SYSTEMS:  [X]  denotes positive finding, [ ]  denotes negative finding Cardiac  Comments:  Chest pain or chest pressure: x   Shortness of breath upon exertion: x   Short of breath when lying flat:    Irregular heart rhythm:        Vascular    Pain in calf, thigh, or hip brought on by ambulation: x   Pain in feet at night that wakes you up from your sleep:  x   Blood clot in your veins:    Leg swelling:         Pulmonary    Oxygen at home:    Productive cough:     Wheezing:         Neurologic    Sudden weakness in arms or legs:     Sudden numbness in arms or legs:     Sudden onset of difficulty speaking or slurred speech:    Temporary loss of vision in one eye:     Problems with dizziness:         Gastrointestinal    Blood in stool:     Vomited blood:         Genitourinary    Burning when urinating:     Blood in urine:        Psychiatric    Major depression:         Hematologic    Bleeding problems:    Problems with blood  clotting too easily:        Skin    Rashes or ulcers:        Constitutional    Fever or chills:     PHYSICAL EXAM:   Vitals:   06/17/19 0849  BP: (!) 152/74  Pulse: 70  Resp: 20  Temp: (!) 97.3 F (36.3 C)  SpO2: 98%  Weight: 145 lb (65.8 kg)  Height: 5\' 2"  (0000000 m)    GENERAL: The patient is a well-nourished female, in no acute distress. The vital signs are documented above. CARDIAC: There is a regular rate and rhythm.  VASCULAR: She has bilateral carotid bruits. I cannot auscultate any abdominal bruits. She has palpable femoral and pedal pulses bilaterally. PULMONARY: There is good air exchange bilaterally without wheezing or rales. ABDOMEN: Soft and non-tender with normal pitched bowel sounds.  MUSCULOSKELETAL: There are no major deformities or cyanosis. NEUROLOGIC: No focal weakness or paresthesias are detected. SKIN: There are no ulcers or rashes noted. PSYCHIATRIC: The patient has a normal affect.  DATA:    MRA ABDOMEN: I reviewed the images of the MRA of the abdomen that was performed on 04/03/2019.  Her MRA suggest that the right kidney is smaller than the left.  There is a focal narrowing in the right renal artery of approximately 50%.  There are 2 left renal arteries which are patent.  There is a stenosis at the origin of the celiac artery suggesting an approximately 50% stenosis.  The superior mesenteric artery is patent.  The inferior mesenteric artery is not visualized.  LABS: I do not have any recent labs.  The most recent my labs I have are from 2019 when her GFR was 44.

## 2019-08-14 IMAGING — CT CT ABD-PELV W/ CM
2 of 5 series · 16 of 46 positions shown, 18 images · IV contrast (ISOVUE 300)
Comparison: None.

CLINICAL DATA: Left lower quadrant abdominal pain and discomfort
for several months.

EXAM:
CT ABDOMEN AND PELVIS WITH CONTRAST
TECHNIQUE: Multidetector CT imaging of the abdomen and pelvis was performed
using the standard protocol following bolus administration of
intravenous contrast.
CONTRAST:  80mL NMUF5L-PLL IOPAMIDOL (NMUF5L-PLL) INJECTION 61%

[Series 2: abd/pel w · axial · 0.79mm/px · z∈[+1085,+1485]mm · 13 of 90 slices shown, 15 images]
[im 5/90  soft-tissue]
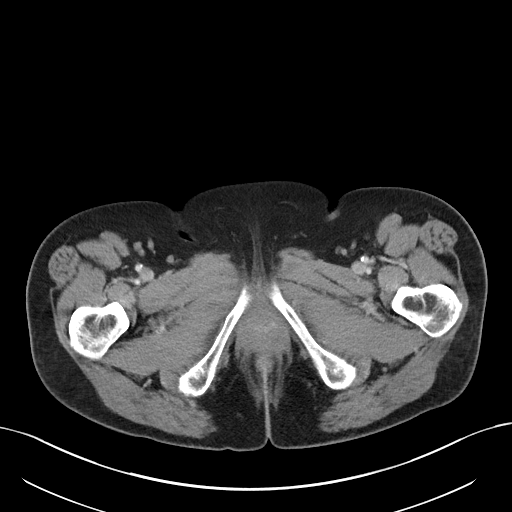
[im 5/90  bone]
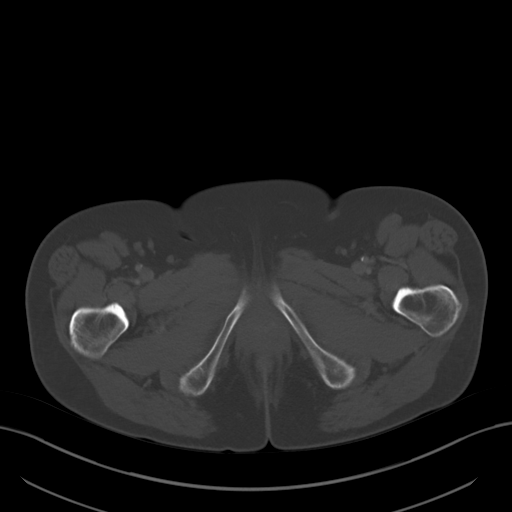
[im 15/90  soft-tissue]
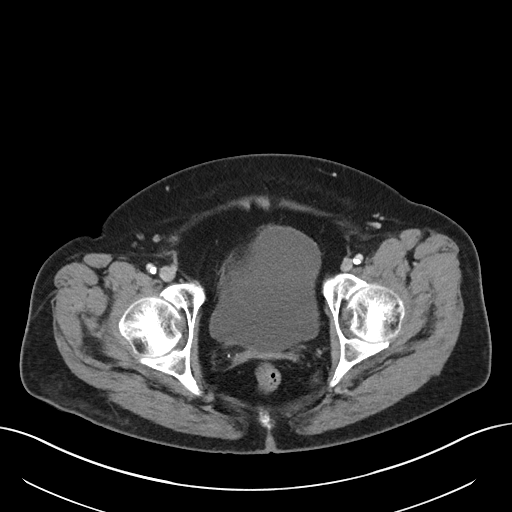
[im 19/90  soft-tissue]
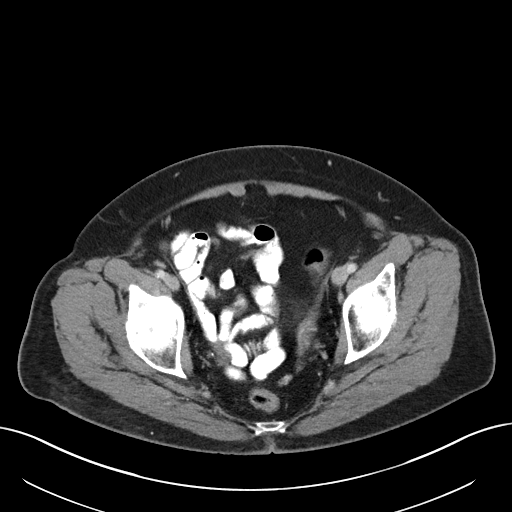
[im 24/90  soft-tissue]
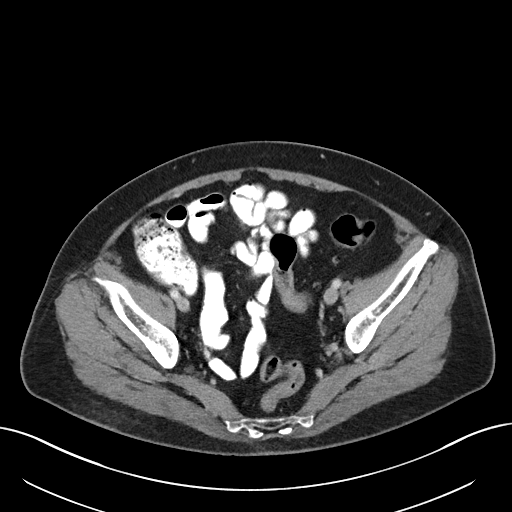
[im 33/90  soft-tissue]
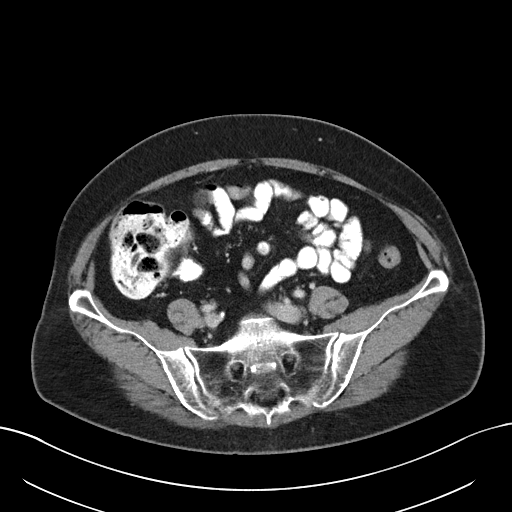
[im 38/90  soft-tissue]
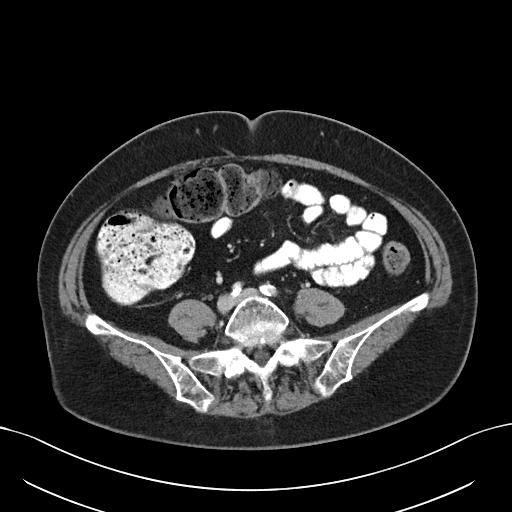
[im 47/90  soft-tissue]
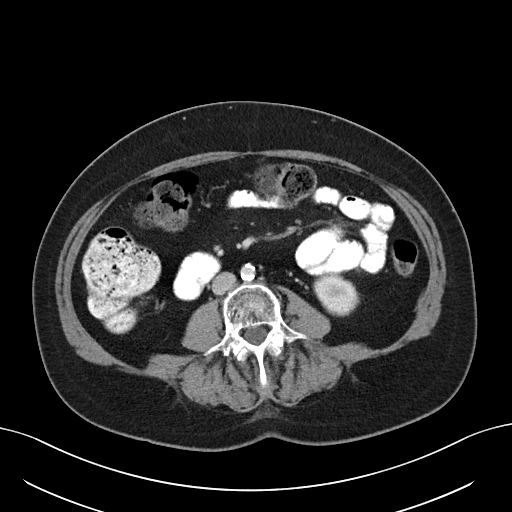
[im 52/90  soft-tissue]
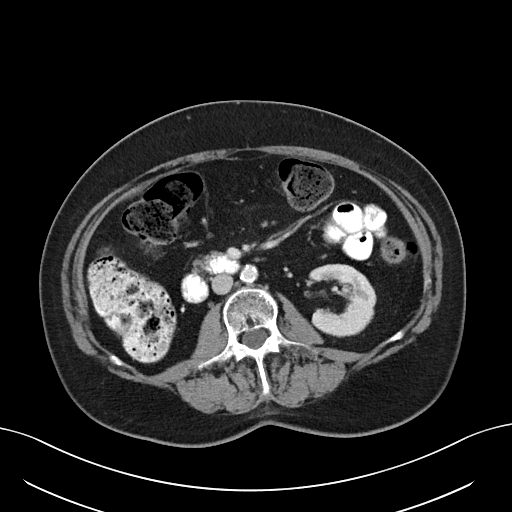
[im 57/90  soft-tissue]
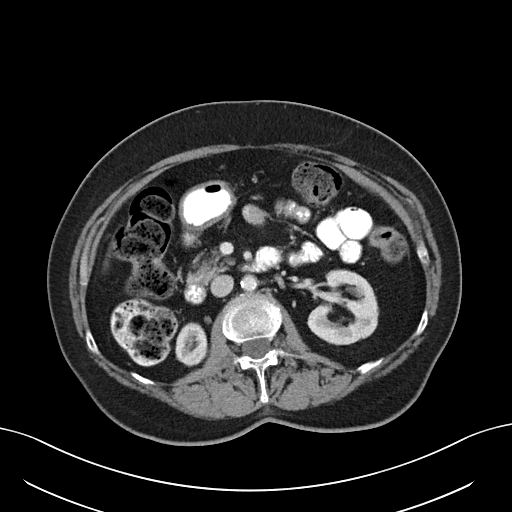
[im 57/90  bone]
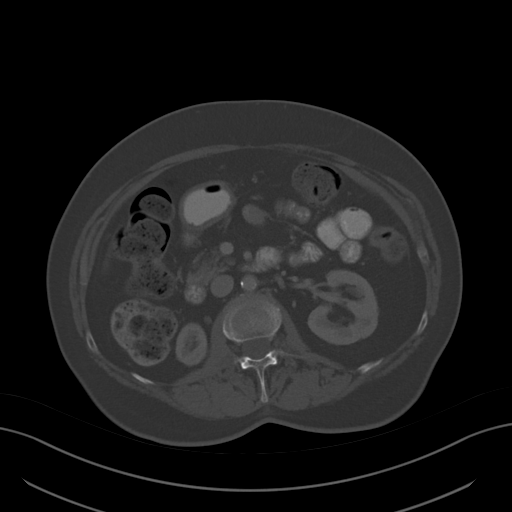
[im 66/90  soft-tissue]
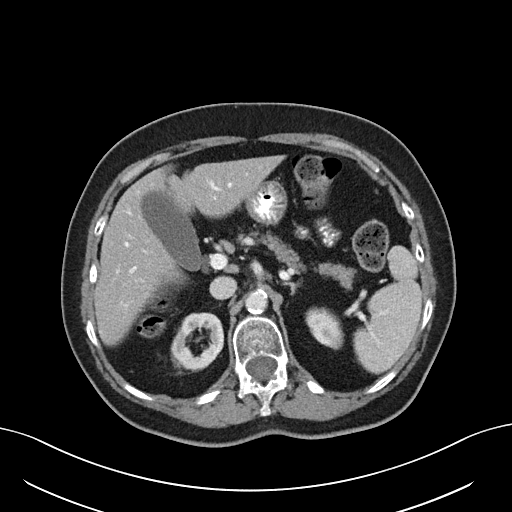
[im 71/90  soft-tissue]
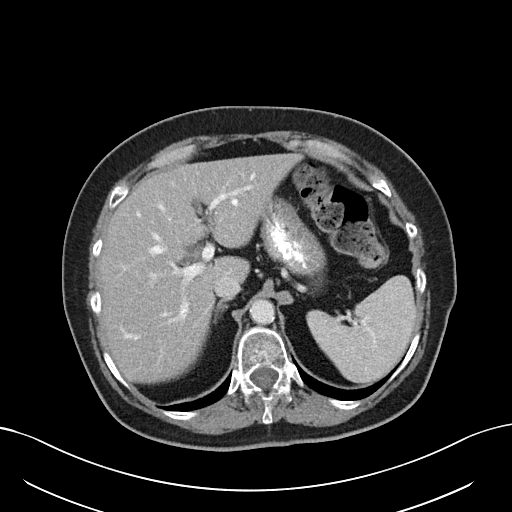
[im 75/90  soft-tissue]
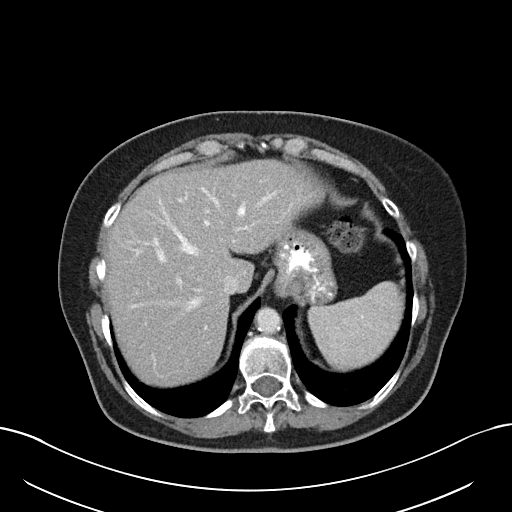
[im 85/90  soft-tissue]
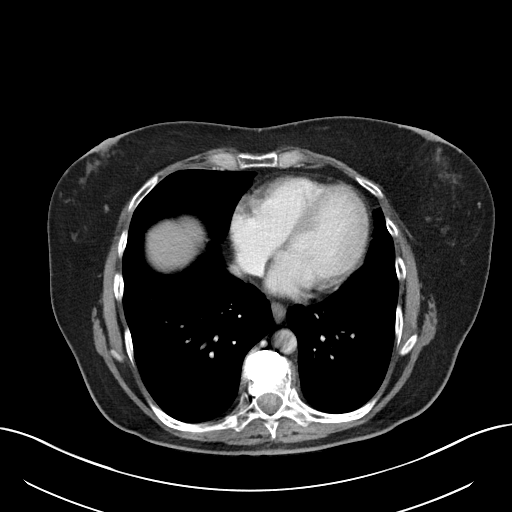

[Series 5: abd/pel w st · coronal · 0.85mm/px · 3 of 92 slices shown]
[im 31/92  soft-tissue]
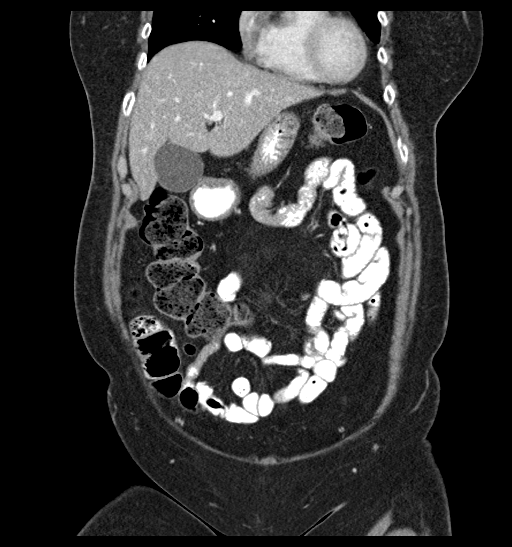
[im 41/92  soft-tissue]
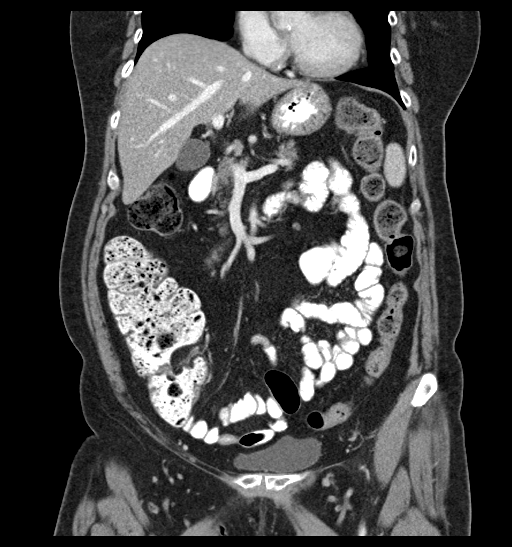
[im 51/92  soft-tissue]
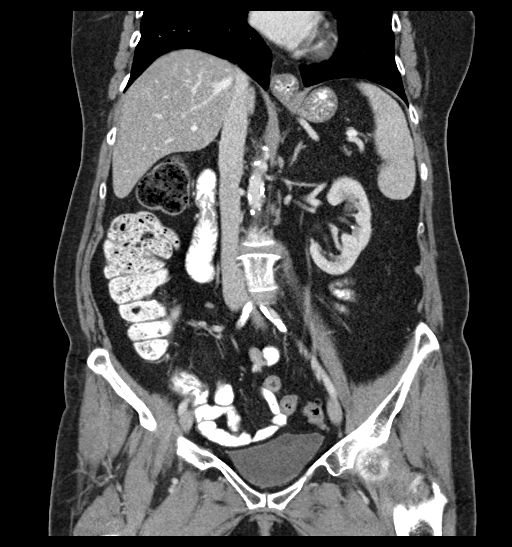

[16 of 46 positions shown; findings below may reference images not displayed]

FINDINGS: Lower chest: Unremarkable

Hepatobiliary: No focal abnormality within the liver parenchyma.
There is no evidence for gallstones, gallbladder wall thickening, or
pericholecystic fluid. No intrahepatic or extrahepatic biliary
dilation.

Pancreas: No focal mass lesion. No dilatation of the main duct. No
intraparenchymal cyst. No peripancreatic edema.

Spleen: No splenomegaly. No focal mass lesion.

Adrenals/Urinary Tract: No adrenal nodule or mass. Kidneys
unremarkable. No evidence for hydroureter. The urinary bladder
appears normal for the degree of distention.

Stomach/Bowel: Tiny hiatal hernia. Stomach otherwise unremarkable.
Duodenum is normally positioned as is the ligament of Treitz. No
small bowel wall thickening. No small bowel dilatation. The terminal
ileum is normal. The appendix is normal. Prominent stool noted right
colon with decompressed left colon. No evidence for stricture or
obstructing lesion.

Vascular/Lymphatic: There is abdominal aortic atherosclerosis
without aneurysm. There is no gastrohepatic or hepatoduodenal
ligament lymphadenopathy. No intraperitoneal or retroperitoneal
lymphadenopathy. No pelvic sidewall lymphadenopathy.

Reproductive: Status post hysterectomy There is no adnexal mass.

Other: No intraperitoneal free fluid.

Musculoskeletal: Bone windows reveal no worrisome lytic or sclerotic
osseous lesions. Status post vertebral augmentation at T9.
IMPRESSION: 1. No acute findings in the abdomen or pelvis. Specifically no
findings to explain the patient's history of left lower quadrant
pain. Mild diverticular disease noted in the left colon without
diverticulitis. No left adnexal mass.
2.  Aortic Atherosclerois ([id number])
3. Tiny hiatal hernia.

## 2019-12-08 ENCOUNTER — Other Ambulatory Visit: Payer: Self-pay | Admitting: Orthopedic Surgery

## 2019-12-08 DIAGNOSIS — S32414A Nondisplaced fracture of anterior wall of right acetabulum, initial encounter for closed fracture: Secondary | ICD-10-CM

## 2019-12-29 ENCOUNTER — Other Ambulatory Visit: Payer: 59

## 2020-01-01 ENCOUNTER — Other Ambulatory Visit: Payer: 59

## 2020-01-07 ENCOUNTER — Other Ambulatory Visit: Payer: Self-pay | Admitting: Orthopedic Surgery

## 2020-01-07 DIAGNOSIS — S32414A Nondisplaced fracture of anterior wall of right acetabulum, initial encounter for closed fracture: Secondary | ICD-10-CM

## 2020-01-13 ENCOUNTER — Inpatient Hospital Stay: Admission: RE | Admit: 2020-01-13 | Payer: 59 | Source: Ambulatory Visit

## 2020-01-13 ENCOUNTER — Other Ambulatory Visit: Payer: 59

## 2020-01-26 ENCOUNTER — Other Ambulatory Visit: Payer: 59

## 2020-01-30 ENCOUNTER — Other Ambulatory Visit: Payer: Self-pay | Admitting: Internal Medicine

## 2020-03-01 ENCOUNTER — Encounter: Payer: Self-pay | Admitting: Gastroenterology

## 2020-03-01 ENCOUNTER — Ambulatory Visit (INDEPENDENT_AMBULATORY_CARE_PROVIDER_SITE_OTHER): Payer: 59 | Admitting: Gastroenterology

## 2020-03-01 ENCOUNTER — Other Ambulatory Visit: Payer: 59

## 2020-03-01 ENCOUNTER — Other Ambulatory Visit: Payer: Self-pay

## 2020-03-01 VITALS — BP 124/60 | HR 81 | Ht 62.5 in | Wt 129.0 lb

## 2020-03-01 DIAGNOSIS — Z8619 Personal history of other infectious and parasitic diseases: Secondary | ICD-10-CM | POA: Diagnosis not present

## 2020-03-01 DIAGNOSIS — R634 Abnormal weight loss: Secondary | ICD-10-CM

## 2020-03-01 DIAGNOSIS — R197 Diarrhea, unspecified: Secondary | ICD-10-CM | POA: Diagnosis not present

## 2020-03-01 NOTE — Patient Instructions (Signed)
If you are age 65 or older, your body mass index should be between 23-30. Your Body mass index is 23.22 kg/m. If this is out of the aforementioned range listed, please consider follow up with your Primary Care Provider.  If you are age 64 or younger, your body mass index should be between 19-25. Your Body mass index is 23.22 kg/m. If this is out of the aformentioned range listed, please consider follow up with your Primary Care Provider.   Your provider has requested that you go to the basement level for lab work before leaving today. Press "B" on the elevator. The lab is located at the first door on the left as you exit the elevator.   Please fax CT scan results to [phone]. AttnNira Conn.  Start daily probiotic such as Games developer.

## 2020-03-01 NOTE — Progress Notes (Signed)
03/01/2020 Breanna Barr [id number] July 27, 1955   HISTORY OF PRESENT ILLNESS: This is a 65 year old female is a patient of Dr. Celesta Aver.  Her last colonoscopy was in June 2020 at which time she was found to have 1 diminutive polyp in the descending colon.  This was a tubular adenoma on pathology.  She is here today with complaints of diarrhea.  She tells me that she has had loose stools since December, it began right after Christmas.  She did see some mucus and bright red blood in her stool in the beginning.  She's also had some nausea and decrease in appetite, but the nausea seems to be improving.  She reports that she has gotten up to have bowel movements several times at night.  She says that she feels full with just a few bites of food.  She reports that her weight is down about 15 pounds or so.  She does have a history of C. difficile.  She tells me that she is having a CT scan of the abdomen and pelvis in [place], [place] tomorrow that has been ordered by her gynecologist.   Past Medical History:  Diagnosis Date  . Barrett's esophagus   . Chronic headaches   . Clostridium difficile infection   . Colitis   . Compression fracture of thoracic vertebra (HCC)    T9  . Esophageal stricture   . Fibromyalgia   . GERD (gastroesophageal reflux disease)   . H/O hiatal hernia   . Hx of adenomatous polyp of colon 07/22/2018  . Hx of Clostridium difficile infection 07/07/2018  . Hypertension   . Osteoarthritis   . Osteoporosis   . Palpitations   . Renal artery stenosis ([place])   . Rheumatoid arthritis ([place])   . Vitamin D deficiency    Past Surgical History:  Procedure Laterality Date  . ABDOMINAL HYSTERECTOMY  1989  . BALLOON DILATION N/A 06/16/2019   Procedure: BALLOON DILATION;  Surgeon: Lavena Bullion, DO;  Location: [facility];  Service: Gastroenterology;  Laterality: N/A;  will need triamcinolone injection  . BIOPSY  06/16/2019   Procedure: BIOPSY;  Surgeon: Lavena Bullion, DO;   Location: [facility];  Service: Gastroenterology;;  . CARDIAC CATHETERIZATION  2013   [facility]07[facility]) mild non-obstructive CAD, normal LV wall motion and function, EF 60-65%, normal PA pressure, mild PVD of the right iliac and right common femoral artery.  . COLONOSCOPY  Last, 2010  . ESOPHAGEAL MANOMETRY N/A 05/27/2019   Procedure: ESOPHAGEAL MANOMETRY (EM);  Surgeon: Gatha Mayer, MD;  Location: [facility];  Service: Endoscopy;  Laterality: N/A;  . ESOPHAGOGASTRODUODENOSCOPY  Multiple   With esophageal dilation  . ESOPHAGOGASTRODUODENOSCOPY (EGD) WITH PROPOFOL N/A 06/16/2019   Procedure: ESOPHAGOGASTRODUODENOSCOPY (EGD) WITH PROPOFOL;  Surgeon: Lavena Bullion, DO;  Location: [facility];  Service: Gastroenterology;  Laterality: N/A;  Will need Triamcinolone injections  . KENALOG INJECTION  06/16/2019   Procedure: KENALOG INJECTION;  Surgeon: Lavena Bullion, DO;  Location: [facility];  Service: Gastroenterology;;  . Estill Batten  02/12/2012   Procedure: KYPHOPLASTY;  Surgeon: Erline Levine, MD;  Location: [facility];  Service: Neurosurgery;  Laterality: N/A;  Thoracic nine Kyphoplasty  . ROTATOR CUFF REPAIR Right 1997  . TOTAL SHOULDER ARTHROPLASTY Left 2017    reports that she quit smoking about 36 years ago. She has never used smokeless tobacco. She reports that she does not drink alcohol and does not use drugs. family history includes Cancer - Cervical in her mother;  Esophageal cancer in her maternal uncle; Stroke in her father and mother. Allergies  Allergen Reactions  . Codeine Other (See Comments)    Rapid heart rate  . Penicillins Shortness Of Breath and Other (See Comments)    Rapid heart rate  . Prolia [Denosumab] Anaphylaxis      Outpatient Encounter Medications as of 03/01/2020  Medication Sig  . amLODipine (NORVASC) 10 MG tablet Take 10 mg by mouth at bedtime.  Marland Kitchen aspirin EC 81 MG tablet Take 81 mg by mouth daily.  . chlorthalidone (HYGROTON) 25 MG tablet  Take 25 mg by mouth daily.  . Cholecalciferol (VITAMIN D3) 25 MCG (1000 UT) CAPS Take 4,000 Units by mouth daily.  . Coenzyme Q10 (COQ-10) 100 MG CAPS Take 100 mg by mouth daily.   Marland Kitchen dexlansoprazole (DEXILANT) 60 MG capsule Take 1 capsule (60 mg total) by mouth daily before breakfast.  . fenofibrate micronized (LOFIBRA) 134 MG capsule Take 134 mg by mouth at bedtime.   Marland Kitchen losartan (COZAAR) 100 MG tablet Take 100 mg by mouth daily.  . metoprolol succinate (TOPROL-XL) 100 MG 24 hr tablet Take 100 mg by mouth 2 (two) times daily. Take with or immediately following a meal.  . ondansetron (ZOFRAN-ODT) 4 MG disintegrating tablet TAKE 1 TABLET BY MOUTH EVERY 8 HOURS AS NEEDED FOR NAUSEA AND VOMITING  . sertraline (ZOLOFT) 50 MG tablet sertraline 50 mg tablet  . traMADol (ULTRAM) 50 MG tablet tramadol 50 mg tablet  . famotidine (PEPCID) 40 MG tablet Take 1 tablet (40 mg total) by mouth at bedtime. (Patient taking differently: Take 40 mg by mouth daily. )   No facility-administered encounter medications on file as of 03/01/2020.    REVIEW OF SYSTEMS  : All other systems reviewed and negative except where noted in the History of Present Illness.  PHYSICAL EXAM: BP 124/60   Pulse 81   Ht 5' 2.5" (1.588 m)   Wt 129 lb (58.5 kg)   BMI 23.22 kg/m  General: Well developed white female in no acute distress Head: Normocephalic and atraumatic Eyes:  Sclerae anicteric, conjunctiva pink. Ears: Normal auditory acuity Lungs: Clear throughout to auscultation; no W/R/R. Heart: Regular rate and rhythm; no M/R/G Abdomen: Soft, non-distended.  BS present.  Non-tender. Musculoskeletal: Symmetrical with no gross deformities  Skin: No lesions on visible extremities Extremities: No edema  Neurological: Alert oriented x 4, grossly non-focal Psychological:  Alert and cooperative. Normal mood and affect  ASSESSMENT AND PLAN: *65 year old female with complaints of loose stools since December with some mucus and  small amounts of bright red blood in the beginning.  Also some nausea and decrease in appetite although those things are improving.  She has history of C. difficile.  Will check stool for C. difficile.  She is having CT scan of the abdomen and pelvis tomorrow that has been ordered by her gynecologist.  I have requested that those results be sent to [place] as well.  For now I have recommended that she begin taking a daily probiotic such as align or Florastor.  Further recommendations to come pending stool study results and CT scan results.   CC:  Thea Alken, FNP

## 2020-03-02 ENCOUNTER — Encounter: Payer: Self-pay | Admitting: Gastroenterology

## 2020-03-02 DIAGNOSIS — R634 Abnormal weight loss: Secondary | ICD-10-CM | POA: Insufficient documentation

## 2020-03-02 DIAGNOSIS — R197 Diarrhea, unspecified: Secondary | ICD-10-CM | POA: Insufficient documentation

## 2020-03-02 LAB — CLOSTRIDIUM DIFFICILE TOXIN B, QUALITATIVE, REAL-TIME PCR: Toxigenic C. Difficile by PCR: NOT DETECTED

## 2020-03-04 LAB — GI PROFILE, STOOL, PCR

## 2020-03-14 ENCOUNTER — Other Ambulatory Visit: Payer: Self-pay | Admitting: Internal Medicine

## 2020-07-19 ENCOUNTER — Other Ambulatory Visit: Payer: Self-pay | Admitting: Internal Medicine

## 2020-10-15 ENCOUNTER — Other Ambulatory Visit: Payer: Self-pay | Admitting: Internal Medicine

## 2020-10-26 ENCOUNTER — Other Ambulatory Visit: Payer: Self-pay | Admitting: Internal Medicine

## 2020-12-14 ENCOUNTER — Other Ambulatory Visit: Payer: Self-pay | Admitting: Internal Medicine

## 2021-07-27 NOTE — Progress Notes (Deleted)
Name: Breanna Barr  MRN/ DOB: [id number], August 24, 1955    Age/ Sex: 66 y.o., female    PCP: Breanna Barr   Reason for Endocrinology Evaluation: Osteoporosis     Date of Initial Endocrinology Evaluation: 07/28/2021     HPI: Ms. Breanna Barr is a 66 y.o. female with a past medical history of Osteoporosis. The patient presented for initial endocrinology clinic visit on 07/28/2021 for consultative assistance with her Osteoporosis.   Pt was diagnosed with osteoporosis:  Menarche at age :  Menopausal at age : S/P hysterectomy 1989 Fracture Hx: S/P T9 kyphoplasty in 2014 . Right hip and pelvic # in 21 S/P fall Hx of HRT:  FH of osteoporosis or hip fracture:  Prior Hx of anti-estrogenic therapy : Prior Hx of anti-resorptive therapy :    HISTORY:  Past Medical History:  Past Medical History:  Diagnosis Date   Barrett's esophagus    Chronic headaches    Clostridium difficile infection    Colitis    Compression fracture of thoracic vertebra (HCC)    T9   Esophageal stricture    Fibromyalgia    GERD (gastroesophageal reflux disease)    H/O hiatal hernia    Hx of adenomatous polyp of colon 07/22/2018   Hx of Clostridium difficile infection 07/07/2018   Hypertension    Osteoarthritis    Osteoporosis    Palpitations    Renal artery stenosis (HCC)    Rheumatoid arthritis ([place])    Vitamin D deficiency    Past Surgical History:  Past Surgical History:  Procedure Laterality Date   ABDOMINAL HYSTERECTOMY  1989   BALLOON DILATION N/A 06/16/2019   Procedure: BALLOON DILATION;  Surgeon: Lavena Bullion, DO;  [facility];  Service: Gastroenterology;  Laterality: N/A;  will need triamcinolone injection   BIOPSY  06/16/2019   Procedure: BIOPSY;  Surgeon: Lavena Bullion, DO;  [facility];  Service: Gastroenterology;;   CARDIAC CATHETERIZATION  2013   06/07/11 [facility]) mild non-obstructive CAD, normal LV wall motion and function, EF 60-65%, normal PA pressure,  mild PVD of the right iliac and right common femoral artery.   COLONOSCOPY  Last, 2010   ESOPHAGEAL MANOMETRY N/A 05/27/2019   Procedure: ESOPHAGEAL MANOMETRY (EM);  Surgeon: Gatha Mayer, MD;  [facility];  Service: Endoscopy;  Laterality: N/A;   ESOPHAGOGASTRODUODENOSCOPY  Multiple   With esophageal dilation   ESOPHAGOGASTRODUODENOSCOPY (EGD) WITH PROPOFOL N/A 06/16/2019   Procedure: ESOPHAGOGASTRODUODENOSCOPY (EGD) WITH PROPOFOL;  Surgeon: Lavena Bullion, DO;  [facility];  Service: Gastroenterology;  Laterality: N/A;  Will need Triamcinolone injections   KENALOG INJECTION  06/16/2019   Procedure: KENALOG INJECTION;  Surgeon: Lavena Bullion, DO;  [facility];  Service: Gastroenterology;;   KYPHOPLASTY  02/12/2012   Procedure: KYPHOPLASTY;  Surgeon: Erline Levine, MD;  Location: [place] [facility];  Service: Neurosurgery;  Laterality: N/A;  Thoracic nine Kyphoplasty   ROTATOR CUFF REPAIR Right 1997   TOTAL SHOULDER ARTHROPLASTY Left 2017    Social History:  reports that she quit smoking about 37 years ago. She has never used smokeless tobacco. She reports that she does not drink alcohol and does not use drugs. Family History: family history includes Cancer - Cervical in her mother; Esophageal cancer in her maternal uncle; Stroke in her father and mother.   HOME MEDICATIONS: Allergies as of 07/28/2021       Reactions   Codeine Other (See Comments)   Rapid heart rate  Penicillins Shortness Of Breath, Other (See Comments)   Rapid heart rate   Prolia [denosumab] Anaphylaxis        Medication List        Accurate as of July 28, 2021  7:42 AM. If you have any questions, ask your nurse or doctor.          amLODipine 10 MG tablet Commonly known as: NORVASC Take 10 mg by mouth at bedtime.   aspirin EC 81 MG tablet Take 81 mg by mouth daily.   chlorthalidone 25 MG tablet Commonly known as: HYGROTON Take 25 mg by mouth daily.   CoQ-10 100 MG  Caps Take 100 mg by mouth daily.   Dexilant 60 MG capsule Generic drug: dexlansoprazole Take 1 capsule (60 mg total) by mouth daily before breakfast.   famotidine 40 MG tablet Commonly known as: PEPCID TAKE 1 TABLET BY MOUTH EVERY DAY   fenofibrate micronized 134 MG capsule Commonly known as: LOFIBRA Take 134 mg by mouth at bedtime.   losartan 100 MG tablet Commonly known as: COZAAR Take 100 mg by mouth daily.   metoprolol succinate 100 MG 24 hr tablet Commonly known as: TOPROL-XL Take 100 mg by mouth 2 (two) times daily. Take with or immediately following a meal.   ondansetron 4 MG disintegrating tablet Commonly known as: ZOFRAN-ODT DISSOLVE 1 TABLET BY MOUTH EVERY 8 HOURS AS NEEDED FOR NAUSEA AND VOMITING   sertraline 50 MG tablet Commonly known as: ZOLOFT sertraline 50 mg tablet   traMADol 50 MG tablet Commonly known as: ULTRAM tramadol 50 mg tablet   Vitamin D3 25 MCG (1000 UT) Caps Take 4,000 Units by mouth daily.          REVIEW OF SYSTEMS: A comprehensive ROS was conducted with the patient and is negative except as per HPI and below:  ROS     OBJECTIVE:  VS: There were no vitals taken for this visit.   Wt Readings from Last 3 Encounters:  03/01/20 129 lb (58.5 kg)  06/17/19 145 lb (65.8 kg)  06/16/19 144 lb (65.3 kg)     EXAM: General: Pt appears well and is in NAD  Hydration: Well-hydrated with moist mucous membranes and good skin turgor  Eyes: External eye exam normal without stare, lid lag or exophthalmos.  EOM intact.  PERRL.  Ears, Nose, Throat: Hearing: Grossly intact bilaterally Dental: Good dentition  Throat: Clear without mass, erythema or exudate  Neck: General: Supple without adenopathy. Thyroid: Thyroid size normal.  No goiter or nodules appreciated. No thyroid bruit.  Lungs: Clear with good BS bilat with no rales, rhonchi, or wheezes  Heart: Auscultation: RRR.  Abdomen: Normoactive bowel sounds, soft, nontender, without masses  or organomegaly palpable  Extremities: Gait and station: Normal gait  Digits and nails: No clubbing, cyanosis, petechiae, or nodes Head and neck: Normal alignment and mobility BL UE: Normal ROM and strength. BL LE: No pretibial edema normal ROM and strength.  Skin: Hair: Texture and amount normal with gender appropriate distribution Skin Inspection: No rashes, acanthosis nigricans/skin tags. No lipohypertrophy Skin Palpation: Skin temperature, texture, and thickness normal to palpation  Neuro: Cranial nerves: II - XII grossly intact  Cerebellar: Normal coordination and movement; no tremor Motor: Normal strength throughout DTRs: 2+ and symmetric in UE without delay in relaxation phase  Mental Status: Judgment, insight: Intact Orientation: Oriented to time, place, and person Memory: Intact for recent and remote events Mood and affect: No depression, anxiety, or agitation  DATA REVIEWED: ***    ASSESSMENT/PLAN/RECOMMENDATIONS:   ***    Medications :  Signed electronically by: Mack Guise, MD  [facility]  [facility] [address] [place], [place] [address] Phone: [phone] FAX: [phone]   CC: Breanna Barr [address] [place] [address] Phone: [phone] Fax: [phone]   Return to Endocrinology clinic as below: Future Appointments  Date Time Provider [place]  07/28/2021 11:10 AM Breanna Barr, Melanie Crazier, MD [id number] None  12/28/2021 10:45 AM Warren Danes, PA-C [facility]

## 2021-07-28 ENCOUNTER — Ambulatory Visit: Payer: 59 | Admitting: Internal Medicine

## 2021-08-13 ENCOUNTER — Other Ambulatory Visit: Payer: Self-pay | Admitting: Internal Medicine

## 2021-12-28 ENCOUNTER — Ambulatory Visit: Payer: 59 | Admitting: Physician Assistant

## 2022-02-11 ENCOUNTER — Other Ambulatory Visit: Payer: Self-pay | Admitting: Internal Medicine

## 2022-02-12 ENCOUNTER — Other Ambulatory Visit: Payer: Self-pay | Admitting: Internal Medicine

## 2022-03-16 ENCOUNTER — Encounter: Payer: Self-pay | Admitting: Internal Medicine

## 2022-03-16 ENCOUNTER — Ambulatory Visit (INDEPENDENT_AMBULATORY_CARE_PROVIDER_SITE_OTHER): Payer: 59 | Admitting: Internal Medicine

## 2022-03-16 VITALS — BP 126/72 | HR 54 | Ht 61.5 in | Wt 143.0 lb

## 2022-03-16 DIAGNOSIS — K222 Esophageal obstruction: Secondary | ICD-10-CM

## 2022-03-16 DIAGNOSIS — R1319 Other dysphagia: Secondary | ICD-10-CM

## 2022-03-16 DIAGNOSIS — K219 Gastro-esophageal reflux disease without esophagitis: Secondary | ICD-10-CM | POA: Diagnosis not present

## 2022-03-16 NOTE — Patient Instructions (Signed)
You have been scheduled for an endoscopy. Please follow written instructions given to you at your visit today. ?If you use inhalers (even only as needed), please bring them with you on the day of your procedure. ? ?Due to recent changes in healthcare laws, you may see the results of your imaging and laboratory studies on MyChart before your provider has had a chance to review them.  We understand that in some cases there may be results that are confusing or concerning to you. Not all laboratory results come back in the same time frame and the provider may be waiting for multiple results in order to interpret others.  Please give us 48 hours in order for your provider to thoroughly review all the results before contacting the office for clarification of your results.  ? ?I appreciate the opportunity to care for you. ?Carl Gessner, MD, FACG ?

## 2022-03-16 NOTE — H&P (View-Only) (Signed)
 Breanna Barr 66 y.o. 01/28/1955 [id number]  Assessment & Plan:   Encounter Diagnoses  Name Primary?   Esophageal dysphagia Yes   Benign esophageal stricture    Gastroesophageal reflux disease, unspecified whether esophagitis present    Since she did well with triamcinolone injection of her esophageal stricture during/with dilation in 2021 we will repeat that.  Scheduled for [facility] March 14.  Further plans pending that.  I cautioned her to avoid eating tough foods like beef and chicken breast etc. in the interim and modify diet to minimize dysphagia.  Note at 1 point I thought she might have Barrett's esophagus but on last biopsies of the distal esophagus there was no intestinal metaplasia.  Subjective:   Chief Complaint: Dysphagia with history of esophageal stricture  HPI Breanna Barr is a 66-year-old woman with a history of recurrent esophageal stricture problems, normal esophageal manometry who last had an upper endoscopy with esophageal dilation of distal esophageal stricture by Dr. Cirigliano.  Balloon dilation to 18mm and triamcinolone injection.  She did well for a number of years but within the last year she started to have recurrent dysphagia to solids and now sometimes liquids though that may be after a food bolus sticks.  Meats like beef and chicken are particular problems.  She is asking for repeat dilation.  She is maintained on Dexilant and Pepcid and denies significant heartburn symptomatology.  Previous biopsies have failed to reveal other causes.  Last colonoscopy 07/15/2018 single adenoma recall 5 years (had the lavage to obtain adequate prep) Allergies  Allergen Reactions   Codeine Other (See Comments)    Rapid heart rate   Penicillins Shortness Of Breath and Other (See Comments)    Rapid heart rate   Prolia [Denosumab] Anaphylaxis   Current Meds  Medication Sig   aspirin EC 81 MG tablet Take 81 mg by mouth daily.   chlorthalidone (HYGROTON) 25 MG tablet  Take 25 mg by mouth daily.   Cholecalciferol (VITAMIN D3) 25 MCG (1000 UT) CAPS Take 4,000 Units by mouth daily.   Coenzyme Q10 (COQ-10) 100 MG CAPS Take 100 mg by mouth daily.    cyclobenzaprine (FLEXERIL) 5 MG tablet Take 5 mg by mouth 3 (three) times daily as needed.   dexlansoprazole (DEXILANT) 60 MG capsule Take 1 capsule (60 mg total) by mouth daily before breakfast.   famotidine (PEPCID) 40 MG tablet TAKE 1 TABLET BY MOUTH EVERY DAY   fenofibrate micronized (LOFIBRA) 134 MG capsule Take 134 mg by mouth at bedtime.    inclisiran (LEQVIO) 284 MG/1.5ML SOSY injection Inject 284 mg into the skin every 6 (six) months.   indomethacin (INDOCIN) 25 MG capsule Take 25 mg by mouth 2 (two) times daily.   losartan (COZAAR) 100 MG tablet Take 100 mg by mouth daily.   metoprolol succinate (TOPROL-XL) 100 MG 24 hr tablet Take 100 mg by mouth 2 (two) times daily. Take with or immediately following a meal.   ondansetron (ZOFRAN-ODT) 4 MG disintegrating tablet DISSOLVE 1 TABLET BY MOUTH EVERY 8 HOURS AS NEEDED FOR NAUSEA AND VOMITING   sertraline (ZOLOFT) 50 MG tablet sertraline 50 mg tablet   Past Medical History:  Diagnosis Date   Barrett's esophagus    Chronic headaches    Clostridium difficile infection    Colitis    Compression fracture of thoracic vertebra (HCC)    T9   Esophageal stricture    Fibromyalgia    GERD (gastroesophageal reflux disease)    H/O hiatal hernia      Hx of adenomatous polyp of colon 07/22/2018   Hx of Clostridium difficile infection 07/07/2018   Hypertension    Osteoarthritis    Osteoporosis    Palpitations    Renal artery stenosis (HCC)    Rheumatoid arthritis (HCC)    Vitamin D deficiency    Past Surgical History:  Procedure Laterality Date   ABDOMINAL HYSTERECTOMY  1989   BALLOON DILATION N/A 06/16/2019   Procedure: BALLOON DILATION;  Surgeon: Cirigliano, Vito V, DO;  [facility];  Service: Gastroenterology;  Laterality: N/A;  will need triamcinolone  injection   BIOPSY  06/16/2019   Procedure: BIOPSY;  Surgeon: Cirigliano, Vito V, DO;  [facility];  Service: Gastroenterology;;   CARDIAC CATHETERIZATION  2013   06/07/11 ([place]) mild non-obstructive CAD, normal LV wall motion and function, EF 60-65%, normal PA pressure, mild PVD of the right iliac and right common femoral artery.   COLONOSCOPY  Last, 2010   ESOPHAGEAL MANOMETRY N/A 05/27/2019   Procedure: ESOPHAGEAL MANOMETRY (EM);  Surgeon: Jonathon Tan E, MD;  [facility];  Service: Endoscopy;  Laterality: N/A;   ESOPHAGOGASTRODUODENOSCOPY  Multiple   With esophageal dilation   ESOPHAGOGASTRODUODENOSCOPY (EGD) WITH PROPOFOL N/A 06/16/2019   Procedure: ESOPHAGOGASTRODUODENOSCOPY (EGD) WITH PROPOFOL;  Surgeon: Cirigliano, Vito V, DO;  [facility];  Service: Gastroenterology;  Laterality: N/A;  Will need Triamcinolone injections   KENALOG INJECTION  06/16/2019   Procedure: KENALOG INJECTION;  Surgeon: Cirigliano, Vito V, DO;  [facility];  Service: Gastroenterology;;   KYPHOPLASTY  02/12/2012   Procedure: KYPHOPLASTY;  Surgeon: Joseph Stern, MD;  Location: [facility];  Service: Neurosurgery;  Laterality: N/A;  Thoracic nine Kyphoplasty   ROTATOR CUFF REPAIR Right 1997   TOTAL SHOULDER ARTHROPLASTY Left 2017   Social History   Social History Narrative   The patient is married. She has raised her great niece and great-nephew. One as a teenager the other is an adult. No biologic children. She is a retired ophthalmologic technician.   2-4 caffeinated beverages daily   No alcohol no tobacco. Remote smoking.   family history includes Cancer - Cervical in her mother; Esophageal cancer in her maternal uncle; Stroke in her father and mother.   Review of Systems As above  Objective:   Physical Exam @BP 126/72   Pulse (!) 54   Ht 5' 1.5" (1.562 m)   Wt 143 lb (64.9 kg)   SpO2 99%   BMI 26.58 kg/m @  General:  NAD Eyes:   anicteric Lungs:   clear Heart::  S1S2 no rubs, murmurs or gallops Abdomen:  soft and nontender, BS+ Ext:   no edema, cyanosis or clubbing    Data Reviewed:  As per HPI 

## 2022-03-16 NOTE — Progress Notes (Signed)
[employer] 67 y.o. [birth year] [id number]  Assessment & Plan:   Encounter Diagnoses  Name Primary?   Esophageal dysphagia Yes   Benign esophageal stricture    Gastroesophageal reflux disease, unspecified whether esophagitis present    Since she did well with triamcinolone injection of her esophageal stricture during/with dilation in 2021 we will repeat that.  Scheduled for [facility] March 14.  Further plans pending that.  I cautioned her to avoid eating tough foods like beef and chicken breast etc. in the interim and modify diet to minimize dysphagia.  Note at 1 point I thought she might have Barrett's esophagus but on last biopsies of the distal esophagus there was no intestinal metaplasia.  Subjective:   Chief Complaint: Dysphagia with history of esophageal stricture  HPI Breanna Barr is a 67 year old woman with a history of recurrent esophageal stricture problems, normal esophageal manometry who last had an upper endoscopy with esophageal dilation of distal esophageal stricture by Dr. Bryan Lemma.  Balloon dilation to 37m and triamcinolone injection.  She did well for a number of years but within the last year she started to have recurrent dysphagia to solids and now sometimes liquids though that may be after a food bolus sticks.  Meats like beef and chicken are particular problems.  She is asking for repeat dilation.  She is maintained on Dexilant and Pepcid and denies significant heartburn symptomatology.  Previous biopsies have failed to reveal other causes.  Last colonoscopy 07/15/2018 single adenoma recall 5 years (had the lavage to obtain adequate prep) Allergies  Allergen Reactions   Codeine Other (See Comments)    Rapid heart rate   Penicillins Shortness Of Breath and Other (See Comments)    Rapid heart rate   Prolia [Denosumab] Anaphylaxis   Current Meds  Medication Sig   aspirin EC 81 MG tablet Take 81 mg by mouth daily.   chlorthalidone (HYGROTON) 25 MG tablet  Take 25 mg by mouth daily.   Cholecalciferol (VITAMIN D3) 25 MCG (1000 UT) CAPS Take 4,000 Units by mouth daily.   Coenzyme Q10 (COQ-10) 100 MG CAPS Take 100 mg by mouth daily.    cyclobenzaprine (FLEXERIL) 5 MG tablet Take 5 mg by mouth 3 (three) times daily as needed.   dexlansoprazole (DEXILANT) 60 MG capsule Take 1 capsule (60 mg total) by mouth daily before breakfast.   famotidine (PEPCID) 40 MG tablet TAKE 1 TABLET BY MOUTH EVERY DAY   fenofibrate micronized (LOFIBRA) 134 MG capsule Take 134 mg by mouth at bedtime.    inclisiran (LEQVIO) 284 MG/1.5ML SOSY injection Inject 284 mg into the skin every 6 (six) months.   indomethacin (INDOCIN) 25 MG capsule Take 25 mg by mouth 2 (two) times daily.   losartan (COZAAR) 100 MG tablet Take 100 mg by mouth daily.   metoprolol succinate (TOPROL-XL) 100 MG 24 hr tablet Take 100 mg by mouth 2 (two) times daily. Take with or immediately following a meal.   ondansetron (ZOFRAN-ODT) 4 MG disintegrating tablet DISSOLVE 1 TABLET BY MOUTH EVERY 8 HOURS AS NEEDED FOR NAUSEA AND VOMITING   sertraline (ZOLOFT) 50 MG tablet sertraline 50 mg tablet   Past Medical History:  Diagnosis Date   Barrett's esophagus    Chronic headaches    Clostridium difficile infection    Colitis    Compression fracture of thoracic vertebra (HCC)    T9   Esophageal stricture    Fibromyalgia    GERD (gastroesophageal reflux disease)    H/O hiatal hernia  Hx of adenomatous polyp of colon 07/22/2018   Hx of Clostridium difficile infection 07/07/2018   Hypertension    Osteoarthritis    Osteoporosis    Palpitations    Renal artery stenosis (HCC)    Rheumatoid arthritis ([place])    Vitamin D deficiency    Past Surgical History:  Procedure Laterality Date   ABDOMINAL HYSTERECTOMY  1989   BALLOON DILATION N/A 06/16/2019   Procedure: BALLOON DILATION;  Surgeon: Lavena Bullion, DO;  [facility];  Service: Gastroenterology;  Laterality: N/A;  will need triamcinolone  injection   BIOPSY  06/16/2019   Procedure: BIOPSY;  Surgeon: Lavena Bullion, DO;  [facility];  Service: Gastroenterology;;   CARDIAC CATHETERIZATION  2013   06/07/11 [facility]) mild non-obstructive CAD, normal LV wall motion and function, EF 60-65%, normal PA pressure, mild PVD of the right iliac and right common femoral artery.   COLONOSCOPY  Last, 2010   ESOPHAGEAL MANOMETRY N/A 05/27/2019   Procedure: ESOPHAGEAL MANOMETRY (EM);  Surgeon: Gatha Mayer, MD;  [facility];  Service: Endoscopy;  Laterality: N/A;   ESOPHAGOGASTRODUODENOSCOPY  Multiple   With esophageal dilation   ESOPHAGOGASTRODUODENOSCOPY (EGD) WITH PROPOFOL N/A 06/16/2019   Procedure: ESOPHAGOGASTRODUODENOSCOPY (EGD) WITH PROPOFOL;  Surgeon: Lavena Bullion, DO;  [facility];  Service: Gastroenterology;  Laterality: N/A;  Will need Triamcinolone injections   KENALOG INJECTION  06/16/2019   Procedure: KENALOG INJECTION;  Surgeon: Lavena Bullion, DO;  [facility];  Service: Gastroenterology;;   KYPHOPLASTY  02/12/2012   Procedure: KYPHOPLASTY;  Surgeon: Erline Levine, MD;  Location: [place] [facility];  Service: Neurosurgery;  Laterality: N/A;  Thoracic nine Kyphoplasty   ROTATOR CUFF REPAIR Right 1997   TOTAL SHOULDER ARTHROPLASTY Left 2017   Social History   Social History Narrative   The patient is married. She has raised her great niece and great-nephew. One as a teenager the other is an adult. No biologic children. She is a retired Conservation officer, nature.   2-4 caffeinated beverages daily   No alcohol no tobacco. Remote smoking.   family history includes Cancer - Cervical in her mother; Esophageal cancer in her maternal uncle; Stroke in her father and mother.   Review of Systems As above  Objective:   Physical Exam '@BP'$  126/72   Pulse (!) 54   Ht 5' 1.5" (1.562 m)   Wt 143 lb (64.9 kg)   SpO2 99%   BMI 26.58 kg/m @  General:  NAD Eyes:   anicteric Lungs:   clear Heart::  S1S2 no rubs, murmurs or gallops Abdomen:  soft and nontender, BS+ Ext:   no edema, cyanosis or clubbing    Data Reviewed:  As per HPI

## 2022-03-28 NOTE — Anesthesia Preprocedure Evaluation (Signed)
Anesthesia Evaluation  Patient identified by MRN, date of birth, ID band Patient awake    Reviewed: Allergy & Precautions, NPO status , Patient's Chart, lab work & pertinent test results, reviewed documented beta blocker date and time   History of Anesthesia Complications Negative for: history of anesthetic complications  Airway Mallampati: III  TM Distance: >3 FB Neck ROM: Full    Dental  (+) Missing,    Pulmonary former smoker   Pulmonary exam normal        Cardiovascular hypertension, Pt. on medications and Pt. on home beta blockers + Peripheral Vascular Disease  Normal cardiovascular exam     Neuro/Psych  Headaches    GI/Hepatic Neg liver ROS, hiatal hernia, PUD,GERD  Medicated,,dysphagia, esophageal stricture   Endo/Other  negative endocrine ROS    Renal/GU negative Renal ROS  negative genitourinary   Musculoskeletal  (+) Arthritis , Rheumatoid disorders,  Fibromyalgia -  Abdominal   Peds  Hematology negative hematology ROS (+)   Anesthesia Other Findings Day of surgery medications reviewed with patient.  Reproductive/Obstetrics negative OB ROS                              Anesthesia Physical Anesthesia Plan  ASA: 2  Anesthesia Plan: MAC   Post-op Pain Management: Minimal or no pain anticipated   Induction:   PONV Risk Score and Plan: 2 and Treatment may vary due to age or medical condition and Propofol infusion  Airway Management Planned: Natural Airway and Nasal Cannula  Additional Equipment: None  Intra-op Plan:   Post-operative Plan:   Informed Consent: I have reviewed the patients History and Physical, chart, labs and discussed the procedure including the risks, benefits and alternatives for the proposed anesthesia with the patient or authorized representative who has indicated his/her understanding and acceptance.       Plan Discussed with:  CRNA  Anesthesia Plan Comments:          Anesthesia Quick Evaluation

## 2022-03-29 ENCOUNTER — Ambulatory Visit (HOSPITAL_COMMUNITY): Payer: 59 | Admitting: Registered Nurse

## 2022-03-29 ENCOUNTER — Encounter (HOSPITAL_COMMUNITY): Payer: Self-pay | Admitting: Internal Medicine

## 2022-03-29 ENCOUNTER — Ambulatory Visit (HOSPITAL_COMMUNITY)
Admission: RE | Admit: 2022-03-29 | Discharge: 2022-03-29 | Disposition: A | Discharge status: Home / Self Care | Payer: 59 | Attending: Internal Medicine | Admitting: Internal Medicine

## 2022-03-29 ENCOUNTER — Ambulatory Visit (HOSPITAL_BASED_OUTPATIENT_CLINIC_OR_DEPARTMENT_OTHER): Payer: 59 | Admitting: Registered Nurse

## 2022-03-29 ENCOUNTER — Encounter (HOSPITAL_COMMUNITY)
Admission: RE | Disposition: A | Discharge status: Home / Self Care | Payer: Self-pay | Source: Home / Self Care | Attending: Internal Medicine

## 2022-03-29 ENCOUNTER — Other Ambulatory Visit: Payer: Self-pay

## 2022-03-29 DIAGNOSIS — Z79899 Other long term (current) drug therapy: Secondary | ICD-10-CM | POA: Diagnosis not present

## 2022-03-29 DIAGNOSIS — K3189 Other diseases of stomach and duodenum: Secondary | ICD-10-CM | POA: Diagnosis not present

## 2022-03-29 DIAGNOSIS — I739 Peripheral vascular disease, unspecified: Secondary | ICD-10-CM | POA: Diagnosis not present

## 2022-03-29 DIAGNOSIS — K449 Diaphragmatic hernia without obstruction or gangrene: Secondary | ICD-10-CM

## 2022-03-29 DIAGNOSIS — K222 Esophageal obstruction: Secondary | ICD-10-CM

## 2022-03-29 DIAGNOSIS — K219 Gastro-esophageal reflux disease without esophagitis: Secondary | ICD-10-CM | POA: Insufficient documentation

## 2022-03-29 DIAGNOSIS — I10 Essential (primary) hypertension: Secondary | ICD-10-CM

## 2022-03-29 DIAGNOSIS — R1319 Other dysphagia: Secondary | ICD-10-CM

## 2022-03-29 DIAGNOSIS — R1310 Dysphagia, unspecified: Secondary | ICD-10-CM | POA: Insufficient documentation

## 2022-03-29 DIAGNOSIS — Z87891 Personal history of nicotine dependence: Secondary | ICD-10-CM

## 2022-03-29 HISTORY — PX: BIOPSY: SHX5522

## 2022-03-29 HISTORY — PX: ESOPHAGOGASTRODUODENOSCOPY (EGD) WITH PROPOFOL: SHX5813

## 2022-03-29 HISTORY — PX: KENALOG INJECTION: SHX5298

## 2022-03-29 SURGERY — ESOPHAGOGASTRODUODENOSCOPY (EGD) WITH PROPOFOL
Anesthesia: Monitor Anesthesia Care

## 2022-03-29 MED ORDER — TRIAMCINOLONE ACETONIDE 40 MG/ML IJ SUSP
INTRAMUSCULAR | Status: AC
  Filled 2022-03-29: qty 1

## 2022-03-29 MED ORDER — GLYCOPYRROLATE PF 0.2 MG/ML IJ SOSY
PREFILLED_SYRINGE | INTRAMUSCULAR | Status: DC | PRN
  Administered 2022-03-29: .1 mg via INTRAVENOUS

## 2022-03-29 MED ORDER — SODIUM CHLORIDE 0.9 % IV SOLN: INTRAVENOUS | Status: DC

## 2022-03-29 MED ORDER — TRIAMCINOLONE ACETONIDE 40 MG/ML IJ SUSP
INTRAMUSCULAR | Status: DC | PRN
  Administered 2022-03-29: 40 mg

## 2022-03-29 MED ORDER — LACTATED RINGERS IV SOLN: INTRAVENOUS | Status: DC | PRN

## 2022-03-29 MED ORDER — PROPOFOL 10 MG/ML IV BOLUS
INTRAVENOUS | Status: DC | PRN
  Administered 2022-03-29: 50 mg via INTRAVENOUS
  Administered 2022-03-29: 25 mg via INTRAVENOUS
  Administered 2022-03-29 (×2): 50 mg via INTRAVENOUS

## 2022-03-29 MED ORDER — SODIUM CHLORIDE (PF) 0.9 % IJ SOLN
INTRAMUSCULAR | Status: AC
  Filled 2022-03-29: qty 10

## 2022-03-29 MED ORDER — TRIAMCINOLONE ACETONIDE 40 MG/ML IJ SUSP: 40.0000 mg | Freq: Once | INTRAMUSCULAR | Status: DC

## 2022-03-29 MED ORDER — LIDOCAINE 2% (20 MG/ML) 5 ML SYRINGE
INTRAMUSCULAR | Status: DC | PRN
  Administered 2022-03-29: 50 mg via INTRAVENOUS

## 2022-03-29 SURGICAL SUPPLY — 15 items

## 2022-03-29 NOTE — Interval H&P Note (Signed)
History and Physical Interval Note:  03/29/2022 8:23 AM  Breanna Barr  has presented today for surgery, with the diagnosis of dysphagia, esophageal stricture.  The various methods of treatment have been discussed with the patient and family. After consideration of risks, benefits and other options for treatment, the patient has consented to  Procedure(s) with comments: ESOPHAGOGASTRODUODENOSCOPY (EGD) WITH PROPOFOL (N/A) - with dilation as a surgical intervention.  The patient's history has been reviewed, patient examined, no change in status, stable for surgery.  I have reviewed the patient's chart and labs.  Questions were answered to the patient's satisfaction.     Silvano Rusk

## 2022-03-29 NOTE — Discharge Instructions (Signed)
I dilated the esophagus as planned + injected the steroid,  and also took some biopsies of the stomach today. Ruling out a stomach infection.  Will contact with results and plans.  I appreciate the opportunity to care for you. Gatha Mayer, MD, FACG   YOU HAD AN ENDOSCOPIC PROCEDURE TODAY: Refer to the procedure report and other information in the discharge instructions given to you for any specific questions about what was found during the examination. If this information does not answer your questions, please call Dr. Celesta Aver office at [phone] to clarify.   YOU SHOULD EXPECT: Some feelings of bloating in the abdomen. Passage of more gas than usual. Walking can help get rid of the air that was put into your GI tract during the procedure and reduce the bloating. If you had a lower endoscopy (such as a colonoscopy or flexible sigmoidoscopy) you may notice spotting of blood in your stool or on the toilet paper. Some abdominal soreness may be present for a day or two, also.  DIET:  Clear liquids only until 10AM, then very soft foods today and then try normal consistency foods tomorrow.  ACTIVITY: Your care partner should take you home directly after the procedure. You should plan to take it easy, moving slowly for the rest of the day. You can resume normal activity the day after the procedure however YOU SHOULD NOT DRIVE, use power tools, machinery or perform tasks that involve climbing or major physical exertion for 24 hours (because of the sedation medicines used during the test).   SYMPTOMS TO REPORT IMMEDIATELY: A gastroenterologist can be reached at any hour. Please call [phone]  for any of the following symptoms:    Following upper endoscopy (EGD, EUS, ERCP, esophageal dilation) Vomiting of blood or coffee ground material  New, significant abdominal pain  New, significant chest pain or pain under the shoulder blades  Painful or persistently difficult swallowing  New  shortness of breath  Black, tarry-looking or red, bloody stools

## 2022-03-29 NOTE — Transfer of Care (Signed)
Immediate Anesthesia Transfer of Care Note  Patient: Breanna Barr  Procedure(s) Performed: ESOPHAGOGASTRODUODENOSCOPY (EGD) WITH PROPOFOL BIOPSY Balloon dilation wire-guided  Patient Location: PACU  Anesthesia Type:MAC  Level of Consciousness: drowsy and patient cooperative  Airway & Oxygen Therapy: Patient connected to face mask oxygen  Post-op Assessment: Report given to RN and Post -op Vital signs reviewed and stable  Post vital signs: Reviewed and stable  Last Vitals:  Vitals Value Taken Time  BP 109/60 03/29/22 0850  Temp    Pulse 78 03/29/22 0850  Resp 17 03/29/22 0851  SpO2 95 % 03/29/22 0850  Vitals shown include unvalidated device data.  Last Pain:  Vitals:   03/29/22 0729  TempSrc: Temporal  PainSc: 0-No pain         Complications: There were no known notable events for this encounter.

## 2022-03-29 NOTE — Op Note (Signed)
[facility] Patient Name: Breanna Barr Procedure Date : 03/29/2022 MRN: [id number] Attending MD: Gatha Mayer , MD, [phone] Date of Birth: 26-Aug-1955 CSN: [id number] Age: 67 Admit Type: Outpatient Procedure:                Upper GI endoscopy Indications:              Dysphagia, Stricture of the esophagus, For therapy                            of esophageal stricture Providers:                Gatha Mayer, MD, Dulcy Fanny, Frazier Richards, Technician Referring MD:              Medicines:                Monitored Anesthesia Care Complications:            No immediate complications. Estimated Blood Loss:     Estimated blood loss was minimal. Procedure:                Pre-Anesthesia Assessment:                           - Prior to the procedure, a History and Physical                            was performed, and patient medications and                            allergies were reviewed. The patient's tolerance of                            previous anesthesia was also reviewed. The risks                            and benefits of the procedure and the sedation                            options and risks were discussed with the patient.                            All questions were answered, and informed consent                            was obtained. Prior Anticoagulants: The patient has                            taken no anticoagulant or antiplatelet agents. ASA                            Grade Assessment: II - A patient with mild systemic  disease. After reviewing the risks and benefits,                            the patient was deemed in satisfactory condition to                            undergo the procedure.                           After obtaining informed consent, the endoscope was                            passed under direct vision. Throughout the                            procedure, the  patient's blood pressure, pulse, and                            oxygen saturations were monitored continuously. The                            [id number]-[id number]) Olympus endoscope was introduced                            through the mouth, and advanced to the second part                            of duodenum. The upper GI endoscopy was                            accomplished without difficulty. The patient                            tolerated the procedure well. Scope In: Scope Out: Findings:      One benign-appearing, intrinsic moderate (circumferential scarring or       stenosis; an endoscope may pass) stenosis was found 30 cm from the       incisors. This stenosis measured 1.5 cm (inner diameter) x less than one       cm (in length). The stenosis was traversed. A TTS dilator was passed       through the scope. Dilation with an 18-19-20 mm balloon dilator was       performed to 20 mm. The dilation site was examined and showed moderate       mucosal disruption. it was further disrupted with forceps. Estimated       blood loss was minimal. Area was injected with 4 x 1cc (40 mg/ml)       triamcinolone for increased dilation effect.      A 5 cm hiatal hernia was present.      The gastroesophageal flap valve was visualized endoscopically and       classified as Hill Grade IV (no fold, wide open lumen, hiatal hernia       present).      Striped mildly erythematous mucosa without bleeding was found in the       gastric antrum. Biopsies were taken with  a cold forceps for histology.       Verification of patient identification for the specimen was done.       Estimated blood loss was minimal.      The exam was otherwise without abnormality.      The cardia and gastric fundus were normal on retroflexion. Impression:               - Benign-appearing esophageal stenosis. Dilated to                            20 mm + forceps disruption. Injected w.                            triamcinolon 10 mg  x 4 quadrants.                           - 5 cm hiatal hernia.                           - Gastroesophageal flap valve classified as Hill                            Grade IV (no fold, wide open lumen, hiatal hernia                            present).                           - Erythematous mucosa in the antrum. Biopsied.                           - The examination was otherwise normal. Recommendation:           - Patient has a contact number available for                            emergencies. The signs and symptoms of potential                            delayed complications were discussed with the                            patient. Return to normal activities tomorrow.                            Written discharge instructions were provided to the                            patient.                           - Clear liquids x 1 hour then soft foods rest of                            day. Start prior diet tomorrow.                           -  Continue present medications. stay on Dexilant                           - Await pathology results. Procedure Code(s):        --- Professional ---                           [phone], Esophagogastroduodenoscopy, flexible,                            transoral; with transendoscopic balloon dilation of                            esophagus (less than 30 mm diameter)                           43239, 59, Esophagogastroduodenoscopy, flexible,                            transoral; with biopsy, single or multiple                           43236, 59, Esophagogastroduodenoscopy, flexible,                            transoral; with directed submucosal injection(s),                            any substance Diagnosis Code(s):        --- Professional ---                           K22.2, Esophageal obstruction                           K44.9, Diaphragmatic hernia without obstruction or                            gangrene                           K31.89, Other  diseases of stomach and duodenum                           R13.10, Dysphagia, unspecified CPT copyright 2022 American Medical Association. All rights reserved. The codes documented in this report are preliminary and upon coder review may  be revised to meet current compliance requirements. Gatha Mayer, MD 03/29/2022 8:58:13 AM This report has been signed electronically. Number of Addenda: 0

## 2022-03-29 NOTE — Anesthesia Postprocedure Evaluation (Signed)
Anesthesia Post Note  Patient: Breanna Barr  Procedure(s) Performed: ESOPHAGOGASTRODUODENOSCOPY (EGD) WITH PROPOFOL BIOPSY Balloon dilation wire-guided KENALOG INJECTION     Patient location during evaluation: PACU Anesthesia Type: MAC Level of consciousness: awake and alert Pain management: pain level controlled Vital Signs Assessment: post-procedure vital signs reviewed and stable Respiratory status: spontaneous breathing, nonlabored ventilation and respiratory function stable Cardiovascular status: blood pressure returned to baseline Postop Assessment: no apparent nausea or vomiting Anesthetic complications: no   There were no known notable events for this encounter.  Last Vitals:  Vitals:   03/29/22 0900 03/29/22 0915  BP: 123/70 136/74  Pulse: 84 78  Resp: 19 (!) 24  Temp:  36.6 C  SpO2: 99% 100%    Last Pain:  Vitals:   03/29/22 0915  TempSrc:   PainSc: 0-No pain                 Marthenia Rolling

## 2022-03-30 ENCOUNTER — Encounter (HOSPITAL_COMMUNITY): Payer: Self-pay | Admitting: Internal Medicine

## 2022-03-30 LAB — SURGICAL PATHOLOGY

## 2022-04-07 ENCOUNTER — Encounter: Payer: Self-pay | Admitting: Internal Medicine

## 2022-05-02 ENCOUNTER — Other Ambulatory Visit: Payer: Self-pay | Admitting: Internal Medicine

## 2022-06-08 ENCOUNTER — Other Ambulatory Visit: Payer: Self-pay | Admitting: Internal Medicine

## 2022-08-03 ENCOUNTER — Other Ambulatory Visit: Payer: Self-pay | Admitting: Internal Medicine

## 2022-08-29 ENCOUNTER — Other Ambulatory Visit: Payer: Self-pay | Admitting: Internal Medicine

## 2022-08-31 ENCOUNTER — Telehealth: Payer: Self-pay | Admitting: Internal Medicine

## 2022-08-31 MED ORDER — DEXLANSOPRAZOLE 60 MG PO CPDR: 60.0000 mg | DELAYED_RELEASE_CAPSULE | Freq: Every day | ORAL | 11 refills | Status: DC

## 2022-08-31 NOTE — Telephone Encounter (Signed)
The dexlansoprazole needs to state that this medication is the only thing she can take to treat her, \ on the script

## 2022-08-31 NOTE — Telephone Encounter (Signed)
I spoke with Breanna Barr and she said she can only take Dexlansoprazole to treat her GERD symptoms. She has had trouble since she was 17 and tried multiple meds for this. She thinks it is going to need a prior authorization. I explained to her that we have a team that works on this. She is not out of her current bottle.

## 2022-08-31 NOTE — Telephone Encounter (Signed)
PT needs a script for dexlansoprazole sent to [facility]

## 2022-09-04 ENCOUNTER — Telehealth: Payer: Self-pay

## 2022-09-04 NOTE — Telephone Encounter (Signed)
Dexlansoprazole has been denied. I will place the fax in Dr Marvell Fuller office and see if he would like to do an appeal letter. She states she has had GERD issues since age 67.

## 2022-09-04 NOTE — Telephone Encounter (Signed)
*  Gastro  Pharmacy Patient Advocate Encounter   Received notification from CoverMyMeds that prior authorization for Dexlansoprazole 60MG  dr capsules  is required/requested.   Insurance verification completed.   The patient is insured through [facility] .   Per test claim: PA required; PA submitted to [facility] via CoverMyMeds Key/confirmation #/EOC [id number] Status is pending

## 2022-09-08 NOTE — Telephone Encounter (Signed)
I have created a letter and routed it to [employer] to try to resolve this issue.

## 2022-09-10 NOTE — Telephone Encounter (Signed)
Just received a fax and the dexlansoprazole has been approved. Lakita has been informed. I will email her a copy of the letter per her request. Confirmed email address.

## 2022-09-10 NOTE — Telephone Encounter (Signed)
I have faxed the appeal letter to [phone]. I left Kaisha a detailed voice mail message that this has been done.

## 2022-09-11 NOTE — Telephone Encounter (Signed)
I have just faxed Breanna Barr the approval letters for her dexalant and her pepcid.

## 2022-11-01 ENCOUNTER — Telehealth: Payer: Self-pay | Admitting: Internal Medicine

## 2022-11-01 NOTE — Telephone Encounter (Signed)
Inbound call from [facility] regarding dexlansoprazole medication. Stated patient has been paying out of pocket for medication and they only see denial letter for medication. Prior auth number [id number] fax [phone]. Please advise, thank you.

## 2022-11-01 NOTE — Telephone Encounter (Signed)
I spoke with Breanna Barr and she said she has met her deductible so her dexlansoprazole should be covered. She is requesting that we refax the letter Dr Leone Payor wrote 09/08/2022 to [facility] at the # below. I have done that and I marked it urgent per her request.

## 2023-01-21 ENCOUNTER — Other Ambulatory Visit: Payer: Self-pay | Admitting: Internal Medicine

## 2023-02-13 ENCOUNTER — Other Ambulatory Visit: Payer: Self-pay | Admitting: Internal Medicine

## 2023-03-18 ENCOUNTER — Other Ambulatory Visit: Payer: Self-pay | Admitting: Neurological Surgery

## 2023-03-19 ENCOUNTER — Other Ambulatory Visit: Payer: Self-pay | Admitting: Neurological Surgery

## 2023-03-20 NOTE — Progress Notes (Signed)
 Surgical Instructions   Your procedure is scheduled on March 26, 2023. Report to [facility] Main Entrance "A" at 10:15 A.M., then check in with the Admitting office. Any questions or running late day of surgery: call [phone]  Questions prior to your surgery date: call [phone], Monday-Friday, 8am-4pm. If you experience any cold or flu symptoms such as cough, fever, chills, shortness of breath, etc. between now and your scheduled surgery, please notify us at the above number.     Remember:  Do not eat or drink after midnight the night before your surgery      Take these medicines the morning of surgery with A SIP OF WATER  dexlansoprazole (DEXILANT)  famotidine (PEPCID)  metoprolol succinate (TOPROL-XL)  sertraline (ZOLOFT)  May take these medicines IF NEEDED: ondansetron (ZOFRAN-ODT)  traMADol (ULTRAM)    One week prior to surgery, STOP taking any Aspirin (unless otherwise instructed by your surgeon) Aleve, Naproxen, Ibuprofen, Motrin, Advil, Goody's, BC's, all herbal medications, fish oil, and non-prescription vitamins.                     Do NOT Smoke (Tobacco/Vaping) for 24 hours prior to your procedure.  If you use a CPAP at night, you may bring your mask/headgear for your overnight stay.   You will be asked to remove any contacts, glasses, piercing's, hearing aid's, dentures/partials prior to surgery. Please bring cases for these items if needed.    Patients discharged the day of surgery will not be allowed to drive home, and someone needs to stay with them for 24 hours.  SURGICAL WAITING ROOM VISITATION Patients may have no more than 2 support people in the waiting area - these visitors may rotate.   Pre-op nurse will coordinate an appropriate time for 1 ADULT support person, who may not rotate, to accompany patient in pre-op.  Children under the age of 83 must have an adult with them who is not the patient and must remain in the main waiting area with an  adult.  If the patient needs to stay at the hospital during part of their recovery, the visitor guidelines for inpatient rooms apply.  Please refer to the [facility] website for the visitor guidelines for any additional information.   If you received a COVID test during your pre-op visit  it is requested that you wear a mask when out in public, stay away from anyone that may not be feeling well and notify your surgeon if you develop symptoms. If you have been in contact with anyone that has tested positive in the last 10 days please notify you surgeon.      Pre-operative 5 CHG Bathing Instructions   You can play a key role in reducing the risk of infection after surgery. Your skin needs to be as free of germs as possible. You can reduce the number of germs on your skin by washing with CHG (chlorhexidine gluconate) soap before surgery. CHG is an antiseptic soap that kills germs and continues to kill germs even after washing.   DO NOT use if you have an allergy to chlorhexidine/CHG or antibacterial soaps. If your skin becomes reddened or irritated, stop using the CHG and notify one of our RNs at [phone].   Please shower with the CHG soap starting 4 days before surgery using the following schedule:     Please keep in mind the following:  DO NOT shave, including legs and underarms, starting the day of your first shower.  You may shave your face at any point before/day of surgery.  Place clean sheets on your bed the day you start using CHG soap. Use a clean washcloth (not used since being washed) for each shower. DO NOT sleep with pets once you start using the CHG.   CHG Shower Instructions:  Wash your face and private area with normal soap. If you choose to wash your hair, wash first with your normal shampoo.  After you use shampoo/soap, rinse your hair and body thoroughly to remove shampoo/soap residue.  Turn the water OFF and apply about 3 tablespoons (45 ml) of CHG soap to a  CLEAN washcloth.  Apply CHG soap ONLY FROM YOUR NECK DOWN TO YOUR TOES (washing for 3-5 minutes)  DO NOT use CHG soap on face, private areas, open wounds, or sores.  Pay special attention to the area where your surgery is being performed.  If you are having back surgery, having someone wash your back for you may be helpful. Wait 2 minutes after CHG soap is applied, then you may rinse off the CHG soap.  Pat dry with a clean towel  Put on clean clothes/pajamas   If you choose to wear lotion, please use ONLY the CHG-compatible lotions that are listed below.  Additional instructions for the day of surgery: DO NOT APPLY any lotions, deodorants, cologne, or perfumes.   Do not bring valuables to the hospital. [facility] is not responsible for any belongings/valuables. Do not wear nail polish, gel polish, artificial nails, or any other type of covering on natural nails (fingers and toes) Do not wear jewelry or makeup Put on clean/comfortable clothes.  Please brush your teeth.  Ask your nurse before applying any prescription medications to the skin.     CHG Compatible Lotions   Aveeno Moisturizing lotion  Cetaphil Moisturizing Cream  Cetaphil Moisturizing Lotion  Clairol Herbal Essence Moisturizing Lotion, Dry Skin  Clairol Herbal Essence Moisturizing Lotion, Extra Dry Skin  Clairol Herbal Essence Moisturizing Lotion, Normal Skin  Curel Age Defying Therapeutic Moisturizing Lotion with Alpha Hydroxy  Curel Extreme Care Body Lotion  Curel Soothing Hands Moisturizing Hand Lotion  Curel Therapeutic Moisturizing Cream, Fragrance-Free  Curel Therapeutic Moisturizing Lotion, Fragrance-Free  Curel Therapeutic Moisturizing Lotion, Original Formula  Eucerin Daily Replenishing Lotion  Eucerin Dry Skin Therapy Plus Alpha Hydroxy Crme  Eucerin Dry Skin Therapy Plus Alpha Hydroxy Lotion  Eucerin Original Crme  Eucerin Original Lotion  Eucerin Plus Crme Eucerin Plus Lotion  Eucerin TriLipid  Replenishing Lotion  Keri Anti-Bacterial Hand Lotion  Keri Deep Conditioning Original Lotion Dry Skin Formula Softly Scented  Keri Deep Conditioning Original Lotion, Fragrance Free Sensitive Skin Formula  Keri Lotion Fast Absorbing Fragrance Free Sensitive Skin Formula  Keri Lotion Fast Absorbing Softly Scented Dry Skin Formula  Keri Original Lotion  Keri Skin Renewal Lotion Keri Silky Smooth Lotion  Keri Silky Smooth Sensitive Skin Lotion  Nivea Body Creamy Conditioning Oil  Nivea Body Extra Enriched Lotion  Nivea Body Original Lotion  Nivea Body Sheer Moisturizing Lotion Nivea Crme  Nivea Skin Firming Lotion  NutraDerm 30 Skin Lotion  NutraDerm Skin Lotion  NutraDerm Therapeutic Skin Cream  NutraDerm Therapeutic Skin Lotion  ProShield Protective Hand Cream  Provon moisturizing lotion  Please read over the following fact sheets that you were given.

## 2023-03-21 ENCOUNTER — Encounter (HOSPITAL_COMMUNITY): Payer: Self-pay

## 2023-03-21 ENCOUNTER — Encounter (HOSPITAL_COMMUNITY)
Admission: RE | Admit: 2023-03-21 | Discharge: 2023-03-21 | Disposition: A | Discharge status: Home / Self Care | Source: Ambulatory Visit | Attending: Neurological Surgery | Admitting: Neurological Surgery

## 2023-03-21 ENCOUNTER — Other Ambulatory Visit: Payer: Self-pay

## 2023-03-21 VITALS — BP 95/66 | HR 62 | Temp 97.7°F | Resp 17 | Ht 61.0 in | Wt 129.8 lb

## 2023-03-21 DIAGNOSIS — I10 Essential (primary) hypertension: Secondary | ICD-10-CM | POA: Insufficient documentation

## 2023-03-21 DIAGNOSIS — K449 Diaphragmatic hernia without obstruction or gangrene: Secondary | ICD-10-CM | POA: Diagnosis not present

## 2023-03-21 DIAGNOSIS — M797 Fibromyalgia: Secondary | ICD-10-CM | POA: Insufficient documentation

## 2023-03-21 DIAGNOSIS — M069 Rheumatoid arthritis, unspecified: Secondary | ICD-10-CM | POA: Insufficient documentation

## 2023-03-21 DIAGNOSIS — Z87891 Personal history of nicotine dependence: Secondary | ICD-10-CM | POA: Diagnosis not present

## 2023-03-21 DIAGNOSIS — Z01818 Encounter for other preprocedural examination: Secondary | ICD-10-CM | POA: Insufficient documentation

## 2023-03-21 HISTORY — DX: Malignant (primary) neoplasm, unspecified: C80.1

## 2023-03-21 LAB — BASIC METABOLIC PANEL
Anion gap: 8 (ref 5–15)
BUN: 13 mg/dL (ref 8–23)
CO2: 30 mmol/L (ref 22–32)
Calcium: 10 mg/dL (ref 8.9–10.3)
Chloride: 96 mmol/L — ABNORMAL LOW (ref 98–111)
Creatinine, Ser: 1.3 mg/dL — ABNORMAL HIGH (ref 0.44–1.00)
GFR, Estimated: 45 mL/min — ABNORMAL LOW (ref >60)
Glucose, Bld: 95 mg/dL (ref 70–99)
Potassium: 3.7 mmol/L (ref 3.5–5.1)
Sodium: 134 mmol/L — ABNORMAL LOW (ref 135–145)

## 2023-03-21 LAB — CBC
HCT: 45.6 % (ref 36.0–46.0)
Hemoglobin: 15.1 g/dL — ABNORMAL HIGH (ref 12.0–15.0)
MCH: 28.7 pg (ref 26.0–34.0)
MCHC: 33.1 g/dL (ref 30.0–36.0)
MCV: 86.5 fL (ref 80.0–100.0)
Platelets: 413 10*3/uL — ABNORMAL HIGH (ref 150–400)
RBC: 5.27 MIL/uL — ABNORMAL HIGH (ref 3.87–5.11)
RDW: 13.2 % (ref 11.5–15.5)
WBC: 6.3 10*3/uL (ref 4.0–10.5)
nRBC: 0 % (ref 0.0–0.2)

## 2023-03-21 LAB — SURGICAL PCR SCREEN
MRSA, PCR: POSITIVE — AB
Staphylococcus aureus: POSITIVE — AB

## 2023-03-21 NOTE — Progress Notes (Signed)
 PCP - Garald Braver, FNP   Cardiologist -   PPM/ICD - denies Device Orders - n/a Rep Notified - n/a  Chest x-ray -  EKG - 03-21-23 Stress Test -  ECHO -  Cardiac Cath -   Sleep Study - Denies CPAP -   DM -denies  Blood Thinner Instructions: denies Aspirin Instructions: n/a  ERAS Protcol - NPO PRE-SURGERY Ensure or G2-   COVID TEST-    Anesthesia review: yes  Patient denies shortness of breath, fever, cough and chest pain at PAT appointment   All instructions explained to the patient, with a verbal understanding of the material. Patient agrees to go over the instructions while at home for a better understanding. Patient also instructed to self quarantine after being tested for COVID-19. The opportunity to ask questions was provided.

## 2023-03-22 ENCOUNTER — Other Ambulatory Visit: Payer: Self-pay | Admitting: Internal Medicine

## 2023-03-22 NOTE — Anesthesia Preprocedure Evaluation (Addendum)
 Anesthesia Evaluation  Patient identified by MRN, date of birth, ID band Patient awake    Reviewed: Allergy & Precautions, NPO status , Patient's Chart, lab work & pertinent test results, reviewed documented beta blocker date and time   Airway Mallampati: II  TM Distance: >3 FB Neck ROM: Full    Dental  (+) Dental Advisory Given   Pulmonary former smoker   breath sounds clear to auscultation       Cardiovascular hypertension, Pt. on medications and Pt. on home beta blockers + Peripheral Vascular Disease   Rhythm:Regular Rate:Normal     Neuro/Psych  Headaches  Neuromuscular disease    GI/Hepatic Neg liver ROS, hiatal hernia,GERD  ,,  Endo/Other  negative endocrine ROS    Renal/GU CRFRenal disease     Musculoskeletal  (+) Arthritis ,  Fibromyalgia -  Abdominal   Peds  Hematology negative hematology ROS (+)   Anesthesia Other Findings   Reproductive/Obstetrics                             Anesthesia Physical Anesthesia Plan  ASA: 2  Anesthesia Plan: General   Post-op Pain Management: Ofirmev IV (intra-op)*   Induction: Intravenous  PONV Risk Score and Plan: 3 and Dexamethasone, Ondansetron and Treatment may vary due to age or medical condition  Airway Management Planned: Oral ETT  Additional Equipment:   Intra-op Plan:   Post-operative Plan: Extubation in OR  Informed Consent: I have reviewed the patients History and Physical, chart, labs and discussed the procedure including the risks, benefits and alternatives for the proposed anesthesia with the patient or authorized representative who has indicated his/her understanding and acceptance.     Dental advisory given  Plan Discussed with: CRNA  Anesthesia Plan Comments: (PAT note by Antionette Poles, PA-C: 68 year old female with pertinent history including former smoker, HTN, hiatal hernia, GERD, Barrett's esophagus, esophageal  stricture, headaches, fibromyalgia, rheumatoid arthritis.  EKG at preop testing notable for sinus bradycardia with rate of 56, possible LAE, left bundle branch block.  Review of available records in Care Everywhere shows left bundle branch block has been present since at least 2017.  She had an echocardiogram 09/13/2019 showing EF 60 to 65%, grade 1 DD, mild aortic regurgitation.  Preop labs reviewed, mild hyponatremia sodium 134, creatinine mildly elevated 1.30, otherwise unremarkable.  TTE 09/13/2019 (Care Everywhere): Summary  1. Overall left ventricular ejection fraction is estimated at 60 to 65%.  2. Normal global left ventricular systolic function.  3. (Grade 1) Mildly abnormal left ventricular diastolic filling.  4. Mild concentric left ventricular hypertrophy.  5. Mild aortic regurgitation.  6. Elevated left ventricular end diastolic pressure by tissue Doppler.    )        Anesthesia Quick Evaluation

## 2023-03-22 NOTE — Progress Notes (Signed)
 Message left for N. Shea regarding PCR results from yesterday

## 2023-03-22 NOTE — Progress Notes (Signed)
 Anesthesia Chart Review:  67 year old female with pertinent history including former smoker, HTN, hiatal hernia, GERD, Barrett's esophagus, esophageal stricture, headaches, fibromyalgia, rheumatoid arthritis.  EKG at preop testing notable for sinus bradycardia with rate of 56, possible LAE, left bundle branch block.  Review of available records in Care Everywhere shows left bundle branch block has been present since at least 2017.  She had an echocardiogram 09/13/2019 showing EF 60 to 65%, grade 1 DD, mild aortic regurgitation.  Preop labs reviewed, mild hyponatremia sodium 134, creatinine mildly elevated 1.30, otherwise unremarkable.  TTE 09/13/2019 (Care Everywhere): Summary   1. Overall left ventricular ejection fraction is estimated at 60 to 65%.   2. Normal global left ventricular systolic function.   3. (Grade 1) Mildly abnormal left ventricular diastolic filling.   4. Mild concentric left ventricular hypertrophy.   5. Mild aortic regurgitation.   6. Elevated left ventricular end diastolic pressure by tissue Doppler.     Zannie Cove [facility]/Anesthesiology Phone [phone] 03/22/2023 10:32 AM

## 2023-03-25 NOTE — Progress Notes (Signed)
 Patient informed of surgical time change for 03/26/2023. Patient to arrive at 0945.

## 2023-03-26 ENCOUNTER — Other Ambulatory Visit: Payer: Self-pay

## 2023-03-26 ENCOUNTER — Encounter (HOSPITAL_COMMUNITY): Payer: Self-pay | Admitting: Neurological Surgery

## 2023-03-26 ENCOUNTER — Ambulatory Visit (HOSPITAL_COMMUNITY)
Admission: RE | Admit: 2023-03-26 | Discharge: 2023-03-26 | Disposition: A | Discharge status: Home / Self Care | Source: Ambulatory Visit | Attending: Neurological Surgery | Admitting: Neurological Surgery

## 2023-03-26 ENCOUNTER — Ambulatory Visit (HOSPITAL_COMMUNITY): Payer: Self-pay | Admitting: Physician Assistant

## 2023-03-26 ENCOUNTER — Encounter (HOSPITAL_COMMUNITY)
Admission: RE | Disposition: A | Discharge status: Home / Self Care | Payer: Self-pay | Source: Ambulatory Visit | Attending: Neurological Surgery

## 2023-03-26 ENCOUNTER — Ambulatory Visit (HOSPITAL_COMMUNITY)

## 2023-03-26 ENCOUNTER — Ambulatory Visit (HOSPITAL_BASED_OUTPATIENT_CLINIC_OR_DEPARTMENT_OTHER): Admitting: Anesthesiology

## 2023-03-26 DIAGNOSIS — Z87891 Personal history of nicotine dependence: Secondary | ICD-10-CM | POA: Diagnosis not present

## 2023-03-26 DIAGNOSIS — K449 Diaphragmatic hernia without obstruction or gangrene: Secondary | ICD-10-CM | POA: Diagnosis not present

## 2023-03-26 DIAGNOSIS — I10 Essential (primary) hypertension: Secondary | ICD-10-CM | POA: Insufficient documentation

## 2023-03-26 DIAGNOSIS — M8008XA Age-related osteoporosis with current pathological fracture, vertebra(e), initial encounter for fracture: Secondary | ICD-10-CM | POA: Insufficient documentation

## 2023-03-26 DIAGNOSIS — M069 Rheumatoid arthritis, unspecified: Secondary | ICD-10-CM | POA: Diagnosis not present

## 2023-03-26 DIAGNOSIS — I2510 Atherosclerotic heart disease of native coronary artery without angina pectoris: Secondary | ICD-10-CM | POA: Diagnosis not present

## 2023-03-26 DIAGNOSIS — Z79899 Other long term (current) drug therapy: Secondary | ICD-10-CM | POA: Diagnosis not present

## 2023-03-26 DIAGNOSIS — I739 Peripheral vascular disease, unspecified: Secondary | ICD-10-CM | POA: Diagnosis not present

## 2023-03-26 DIAGNOSIS — K219 Gastro-esophageal reflux disease without esophagitis: Secondary | ICD-10-CM | POA: Diagnosis not present

## 2023-03-26 DIAGNOSIS — M797 Fibromyalgia: Secondary | ICD-10-CM | POA: Insufficient documentation

## 2023-03-26 HISTORY — PX: KYPHOPLASTY: SHX5884

## 2023-03-26 SURGERY — KYPHOPLASTY
Anesthesia: General | Site: Back

## 2023-03-26 MED ORDER — DEXAMETHASONE SODIUM PHOSPHATE 10 MG/ML IJ SOLN
INTRAMUSCULAR | Status: AC
  Filled 2023-03-26: qty 1

## 2023-03-26 MED ORDER — ONDANSETRON HCL 4 MG/2ML IJ SOLN
INTRAMUSCULAR | Status: DC | PRN
Start: 2023-03-26 — End: 2023-03-26
  Administered 2023-03-26: 4 mg via INTRAVENOUS

## 2023-03-26 MED ORDER — LIDOCAINE 2% (20 MG/ML) 5 ML SYRINGE
INTRAMUSCULAR | Status: DC | PRN
  Administered 2023-03-26: 60 mg via INTRAVENOUS

## 2023-03-26 MED ORDER — MIDAZOLAM HCL 2 MG/2ML IJ SOLN
INTRAMUSCULAR | Status: AC
  Filled 2023-03-26: qty 2

## 2023-03-26 MED ORDER — EPHEDRINE SULFATE-NACL 50-0.9 MG/10ML-% IV SOSY
PREFILLED_SYRINGE | INTRAVENOUS | Status: DC | PRN
  Administered 2023-03-26: 5 mg via INTRAVENOUS

## 2023-03-26 MED ORDER — PHENYLEPHRINE HCL-NACL 20-0.9 MG/250ML-% IV SOLN
INTRAVENOUS | Status: DC | PRN
Start: 2023-03-26 — End: 2023-03-26
  Administered 2023-03-26: 240 ug/min via INTRAVENOUS

## 2023-03-26 MED ORDER — LACTATED RINGERS IV SOLN: INTRAVENOUS | Status: DC

## 2023-03-26 MED ORDER — FENTANYL CITRATE (PF) 250 MCG/5ML IJ SOLN
INTRAMUSCULAR | Status: DC | PRN
  Administered 2023-03-26 (×2): 50 ug via INTRAVENOUS
  Administered 2023-03-26: 25 ug via INTRAVENOUS

## 2023-03-26 MED ORDER — BUPIVACAINE-EPINEPHRINE (PF) 0.25% -1:200000 IJ SOLN
INTRAMUSCULAR | Status: AC
  Filled 2023-03-26: qty 30

## 2023-03-26 MED ORDER — ROCURONIUM BROMIDE 10 MG/ML (PF) SYRINGE
PREFILLED_SYRINGE | INTRAVENOUS | Status: DC | PRN
  Administered 2023-03-26: 40 mg via INTRAVENOUS

## 2023-03-26 MED ORDER — 0.9 % SODIUM CHLORIDE (POUR BTL) OPTIME
TOPICAL | Status: DC | PRN
  Administered 2023-03-26: 1000 mL

## 2023-03-26 MED ORDER — EPHEDRINE 5 MG/ML INJ
INTRAVENOUS | Status: AC
  Filled 2023-03-26: qty 5

## 2023-03-26 MED ORDER — SUGAMMADEX SODIUM 200 MG/2ML IV SOLN
INTRAVENOUS | Status: AC
  Filled 2023-03-26: qty 2

## 2023-03-26 MED ORDER — ORAL CARE MOUTH RINSE: 15.0000 mL | Freq: Once | OROMUCOSAL | Status: AC

## 2023-03-26 MED ORDER — CHLORHEXIDINE GLUCONATE 0.12 % MT SOLN
15.0000 mL | Freq: Once | OROMUCOSAL | Status: AC
  Administered 2023-03-26: 15 mL via OROMUCOSAL
  Filled 2023-03-26: qty 15

## 2023-03-26 MED ORDER — FENTANYL CITRATE (PF) 250 MCG/5ML IJ SOLN
INTRAMUSCULAR | Status: AC
  Filled 2023-03-26: qty 5

## 2023-03-26 MED ORDER — FENTANYL CITRATE (PF) 100 MCG/2ML IJ SOLN: 25.0000 ug | INTRAMUSCULAR | Status: DC | PRN

## 2023-03-26 MED ORDER — AMISULPRIDE (ANTIEMETIC) 5 MG/2ML IV SOLN
10.0000 mg | Freq: Once | INTRAVENOUS | Status: AC | PRN
  Administered 2023-03-26: 10 mg via INTRAVENOUS

## 2023-03-26 MED ORDER — ALBUMIN HUMAN 5 % IV SOLN: INTRAVENOUS | Status: DC | PRN

## 2023-03-26 MED ORDER — BUPIVACAINE-EPINEPHRINE 0.25% -1:200000 IJ SOLN
INTRAMUSCULAR | Status: DC | PRN
  Administered 2023-03-26: 5 mL

## 2023-03-26 MED ORDER — CHLORHEXIDINE GLUCONATE CLOTH 2 % EX PADS: 6.0000 | MEDICATED_PAD | Freq: Once | CUTANEOUS | Status: DC

## 2023-03-26 MED ORDER — ONDANSETRON HCL 4 MG/2ML IJ SOLN
INTRAMUSCULAR | Status: AC
  Filled 2023-03-26: qty 2

## 2023-03-26 MED ORDER — PROPOFOL 10 MG/ML IV BOLUS
INTRAVENOUS | Status: DC | PRN
  Administered 2023-03-26: 100 mg via INTRAVENOUS
  Administered 2023-03-26: 10 mg via INTRAVENOUS

## 2023-03-26 MED ORDER — LIDOCAINE-EPINEPHRINE 1 %-1:100000 IJ SOLN
INTRAMUSCULAR | Status: DC | PRN
  Administered 2023-03-26: 5 mL

## 2023-03-26 MED ORDER — ROCURONIUM BROMIDE 10 MG/ML (PF) SYRINGE
PREFILLED_SYRINGE | INTRAVENOUS | Status: AC
  Filled 2023-03-26: qty 10

## 2023-03-26 MED ORDER — CHLORHEXIDINE GLUCONATE 0.12 % MT SOLN: 15.0000 mL | Freq: Once | OROMUCOSAL | Status: AC

## 2023-03-26 MED ORDER — SUGAMMADEX SODIUM 200 MG/2ML IV SOLN
INTRAVENOUS | Status: DC | PRN
  Administered 2023-03-26: 200 mg via INTRAVENOUS

## 2023-03-26 MED ORDER — PHENYLEPHRINE 80 MCG/ML (10ML) SYRINGE FOR IV PUSH (FOR BLOOD PRESSURE SUPPORT)
PREFILLED_SYRINGE | INTRAVENOUS | Status: AC
  Filled 2023-03-26: qty 10

## 2023-03-26 MED ORDER — AMISULPRIDE (ANTIEMETIC) 5 MG/2ML IV SOLN
INTRAVENOUS | Status: AC
  Filled 2023-03-26: qty 4

## 2023-03-26 MED ORDER — VANCOMYCIN HCL IN DEXTROSE 1-5 GM/200ML-% IV SOLN
1000.0000 mg | INTRAVENOUS | Status: AC
  Administered 2023-03-26: 1000 mg via INTRAVENOUS
  Filled 2023-03-26: qty 200

## 2023-03-26 MED ORDER — IOHEXOL 300 MG/ML  SOLN
INTRAMUSCULAR | Status: DC | PRN
Start: 2023-03-26 — End: 2023-03-26
  Administered 2023-03-26: 10 mL

## 2023-03-26 MED ORDER — LIDOCAINE-EPINEPHRINE 1 %-1:100000 IJ SOLN
INTRAMUSCULAR | Status: AC
  Filled 2023-03-26: qty 1

## 2023-03-26 MED ORDER — LIDOCAINE 2% (20 MG/ML) 5 ML SYRINGE
INTRAMUSCULAR | Status: AC
  Filled 2023-03-26: qty 5

## 2023-03-26 MED ORDER — MIDAZOLAM HCL 2 MG/2ML IJ SOLN
INTRAMUSCULAR | Status: DC | PRN
  Administered 2023-03-26: 2 mg via INTRAVENOUS

## 2023-03-26 MED ORDER — DEXAMETHASONE SODIUM PHOSPHATE 10 MG/ML IJ SOLN
INTRAMUSCULAR | Status: DC | PRN
  Administered 2023-03-26: 10 mg via INTRAVENOUS

## 2023-03-26 MED ORDER — PROPOFOL 10 MG/ML IV BOLUS
INTRAVENOUS | Status: AC
  Filled 2023-03-26: qty 20

## 2023-03-26 SURGICAL SUPPLY — 37 items
BAG COUNTER SPONGE SURGICOUNT (BAG) ×1 IMPLANT
BLADE SURG 15 STRL LF DISP TIS (BLADE) ×1 IMPLANT
CEMENT KYPHON C01A KIT/MIXER (Cement) IMPLANT
COVER MAYO STAND STRL (DRAPES) ×1 IMPLANT
DERMABOND ADVANCED .7 DNX12 (GAUZE/BANDAGES/DRESSINGS) ×1 IMPLANT
DRAPE C-ARM 42X72 X-RAY (DRAPES) ×1 IMPLANT
DRAPE INCISE IOBAN 66X45 STRL (DRAPES) ×1 IMPLANT
DRAPE LAPAROTOMY 100X72X124 (DRAPES) ×1 IMPLANT
DRAPE SURG 17X23 STRL (DRAPES) ×1 IMPLANT
DRAPE WARM FLUID 44X44 (DRAPES) ×1 IMPLANT
DURAPREP 26ML APPLICATOR (WOUND CARE) ×1 IMPLANT
ELECT COATED BLADE 2.86 ST (ELECTRODE) ×1 IMPLANT
GAUZE 4X4 16PLY ~~LOC~~+RFID DBL (SPONGE) IMPLANT
GAUZE SPONGE 4X4 12PLY STRL (GAUZE/BANDAGES/DRESSINGS) ×1 IMPLANT
GLOVE BIO SURGEON STRL SZ7 (GLOVE) ×1 IMPLANT
GLOVE BIOGEL PI IND STRL 7.5 (GLOVE) ×1 IMPLANT
GLOVE BIOGEL PI IND STRL 8 (GLOVE) ×1 IMPLANT
GLOVE ECLIPSE 8.0 STRL XLNG CF (GLOVE) ×1 IMPLANT
GOWN STRL REUS W/ TWL LRG LVL3 (GOWN DISPOSABLE) IMPLANT
GOWN STRL REUS W/ TWL XL LVL3 (GOWN DISPOSABLE) ×2 IMPLANT
GOWN STRL REUS W/TWL 2XL LVL3 (GOWN DISPOSABLE) IMPLANT
KIT BASIN OR (CUSTOM PROCEDURE TRAY) ×1 IMPLANT
KIT POSITION SURG JACKSON T1 (MISCELLANEOUS) ×1 IMPLANT
KIT TURNOVER KIT B (KITS) ×1 IMPLANT
NDL HYPO 22X1.5 SAFETY MO (MISCELLANEOUS) ×1 IMPLANT
NEEDLE HYPO 22X1.5 SAFETY MO (MISCELLANEOUS) ×1 IMPLANT
NS IRRIG 1000ML POUR BTL (IV SOLUTION) ×1 IMPLANT
PACK SRG BSC III STRL LF ECLPS (CUSTOM PROCEDURE TRAY) ×1 IMPLANT
PAD ARMBOARD 7.5X6 YLW CONV (MISCELLANEOUS) ×3 IMPLANT
SPIKE FLUID TRANSFER (MISCELLANEOUS) ×1 IMPLANT
SPONGE T-LAP 4X18 ~~LOC~~+RFID (SPONGE) IMPLANT
STAPLER VISISTAT (STAPLE) ×1 IMPLANT
SUT VIC AB 3-0 SH 8-18 (SUTURE) ×1 IMPLANT
TOWEL GREEN STERILE (TOWEL DISPOSABLE) IMPLANT
TOWEL GREEN STERILE FF (TOWEL DISPOSABLE) IMPLANT
TRAY KYPHOPAK 15/3 ONESTEP 1ST (MISCELLANEOUS) IMPLANT
WATER STERILE IRR 1000ML POUR (IV SOLUTION) ×1 IMPLANT

## 2023-03-26 NOTE — Op Note (Signed)
   Providing Compassionate, Quality Care - Together  Date of service: 03/26/2023  PREOP DIAGNOSIS:  L2 superior endplate fracture  POSTOP DIAGNOSIS: Same  PROCEDURE: L2 kyphoplasty, left unipedicular approach, [employer] cement  SURGEON: Dr. Kendell Bane C. Lowen Barringer, DO  ASSISTANT: None  ANESTHESIA: General Endotracheal  EBL: Minimal   SPECIMENS: None  DRAINS: None  COMPLICATIONS: None  CONDITION: Hemodynamically stable  HISTORY: Breanna Barr is a 68 y.o. female with a history of osteoporosis, and severe acute onset low back pain.  Imaging work up revealed acute superior endplate at L2 on MRI.  She failed conservative measures including pain control and her pain was intractable causing her difficulty with daily activities.  Therefore offered her surgical intervention in the form of an L2 kyphoplasty.  All risks, benefits and expected outcomes were discussed and agreed upon.  PROCEDURE IN DETAIL: The patient was brought to the operating room. After induction of general anesthesia, the patient was positioned on the operative table in the prone position. All pressure points were meticulously padded. Skin incision was then marked out and prepped and draped in the usual sterile fashion. Physician driven timeout was performed.  Using biplanar fluoroscopy, the L2 vertebral body was lined up.  Local anesthetic was injected in the skin overlying the L2 pedicle on the left.  Using a 15 blade, stab incision was made, Jamshidi was used to access the L2 pedicle with care to not violate medially or inferiorly.  I was able to get to midline with unit pedicular approach.  A drill was used to create a cavity, followed by balloon which was inflated and there was slight reduction in the superior endplate fracture.  We then using live biplanar Fossier filled the superior one third of the L2 vertebral body with cement and had successful bilateral fill along the superior endplate fracture without extravasation.   Once there was bilateral fill and superior endplate fill, we did not place any further cement.  Appropriate time was allowed for the cement to set.  Jamshidi was carefully removed and there was no retropulsion of cement.  Hemostasis was achieved.  Final AP and lateral images revealed appropriate cement placement at L2 superior endplate.  The wound was closed with Steri-Strips and benzoin.  Sterile dressing applied.  At the end of the case all sponge, needle, and instrument counts were correct. The patient was then transferred to the stretcher, extubated, and taken to the post-anesthesia care unit in stable hemodynamic condition.

## 2023-03-26 NOTE — Transfer of Care (Signed)
 Immediate Anesthesia Transfer of Care Note  Patient: Breanna Barr  Procedure(s) Performed: KYPHOPLASTY L2 (Back)  Patient Location: PACU  Anesthesia Type:General  Level of Consciousness: awake, oriented, and sedated  Airway & Oxygen Therapy: Patient Spontanous Breathing and Patient connected to face mask oxygen  Post-op Assessment: Report given to RN and Post -op Vital signs reviewed and stable  Post vital signs: Reviewed and stable  Last Vitals:  Vitals Value Taken Time  BP 145/51 03/26/23 1300  Temp 36.4 C 03/26/23 1300  Pulse 73 03/26/23 1304  Resp 20 03/26/23 1304  SpO2 98 % 03/26/23 1304  Vitals shown include unfiled device data.  Last Pain:  Vitals:   03/26/23 1000  TempSrc:   PainSc: 10-Worst pain ever      Patients Stated Pain Goal: 2 (03/26/23 1000)  Complications: No notable events documented.

## 2023-03-26 NOTE — Progress Notes (Signed)
 Called to room by nurse tech stating patient was itching and iv was swollen. Found patient's IV had infiltrated. Removed and replaced at this time. Vancomycin finished but made anesthesia aware that patient was c/o itching at her IV site and her scalp. No new orders at this time.

## 2023-03-26 NOTE — Anesthesia Procedure Notes (Signed)
 Procedure Name: Intubation Date/Time: 03/26/2023 11:50 AM  Performed by: Heron Sabins, CRNAPre-anesthesia Checklist: Patient identified, Emergency Drugs available, Suction available and Patient being monitored Patient Re-evaluated:Patient Re-evaluated prior to induction Oxygen Delivery Method: Circle System Utilized Preoxygenation: Pre-oxygenation with 100% oxygen Induction Type: IV induction Ventilation: Mask ventilation without difficulty Laryngoscope Size: Glidescope (Anterior aiway with a Mac 3 blade.) Grade View: Grade II Tube type: Oral Tube size: 7.0 mm Number of attempts: 1 Airway Equipment and Method: Stylet and Oral airway Placement Confirmation: ETT inserted through vocal cords under direct vision, positive ETCO2 and breath sounds checked- equal and bilateral Secured at: 22 cm Tube secured with: Tape Dental Injury: Teeth and Oropharynx as per pre-operative assessment

## 2023-03-26 NOTE — H&P (Signed)
 Providing Compassionate, Quality Care - Together  NEUROSURGERY HISTORY & PHYSICAL   Lamika Scherger is an 68 y.o. female.   Chief Complaint: low back pain HPI: This is a 68 year old female with a history of acute on chronic left-sided low back pain after significant coughing episode.  She denies any numbness, tingling or weakness in her lower extremities.  Workup revealed acute compression fracture at L2 without significant retropulsion, with mild loss of height.  She presents today for L2 kyphoplasty due to her significant amount of pain which has been altering her daily lifestyle.  Past Medical History:  Diagnosis Date   Barrett's esophagus    Cancer (HCC)    cerival ca   Chronic headaches    Clostridium difficile infection    Colitis    Compression fracture of thoracic vertebra (HCC)    T9   Esophageal stricture    Fibromyalgia    GERD (gastroesophageal reflux disease)    H/O hiatal hernia    Hx of adenomatous polyp of colon 07/22/2018   Hx of Clostridium difficile infection 07/07/2018   Hypertension    Osteoarthritis    Osteoporosis    Palpitations    Renal artery stenosis (HCC)    Rheumatoid arthritis (HCC)    Vitamin D deficiency     Past Surgical History:  Procedure Laterality Date   ABDOMINAL HYSTERECTOMY  1989   BALLOON DILATION N/A 06/16/2019   Procedure: BALLOON DILATION;  Surgeon: Shellia Cleverly, DO;  Location: [facility];  Service: Gastroenterology;  Laterality: N/A;  will need triamcinolone injection   BIOPSY  06/16/2019   Procedure: BIOPSY;  Surgeon: Shellia Cleverly, DO;  Location: [facility];  Service: Gastroenterology;;   BIOPSY  03/29/2022   Procedure: BIOPSY;  Surgeon: Iva Boop, MD;  Location: [facility] ENDOSCOPY;  Service: Gastroenterology;;   CARDIAC CATHETERIZATION  2013   06/07/11 [facility]) mild non-obstructive CAD, normal LV wall motion and function, EF 60-65%, normal PA pressure, mild PVD of the right iliac and right common femoral artery.    COLONOSCOPY  Last, 2010   ESOPHAGEAL MANOMETRY N/A 05/27/2019   Procedure: ESOPHAGEAL MANOMETRY (EM);  Surgeon: Iva Boop, MD;  Location: [facility]OS[facility];  Service: Endoscopy;  Laterality: N/A;   ESOPHAGOGASTRODUODENOSCOPY  Multiple   With esophageal dilation   ESOPHAGOGASTRODUODENOSCOPY (EGD) WITH PROPOFOL N/A 06/16/2019   Procedure: ESOPHAGOGASTRODUODENOSCOPY (EGD) WITH PROPOFOL;  Surgeon: Shellia Cleverly, DO;  Location: [facility];  Service: Gastroenterology;  Laterality: N/A;  Will need Triamcinolone injections   ESOPHAGOGASTRODUODENOSCOPY (EGD) WITH PROPOFOL N/A 03/29/2022   Procedure: ESOPHAGOGASTRODUODENOSCOPY (EGD) WITH PROPOFOL;  Surgeon: Iva Boop, MD;  Location: [facility] ENDOSCOPY;  Service: Gastroenterology;  Laterality: N/A;  with dilation   KENALOG INJECTION  06/16/2019   Procedure: KENALOG INJECTION;  Surgeon: Shellia Cleverly, DO;  Location: [facility] ENDOSCOPY;  Service: Gastroenterology;;   Pauline Good INJECTION  03/29/2022   Procedure: Pauline Good INJECTION;  Surgeon: Iva Boop, MD;  Location: [facility] ENDOSCOPY;  Service: Gastroenterology;;   KYPHOPLASTY  02/12/2012   Procedure: KYPHOPLASTY;  Surgeon: Maeola Harman, MD;  Location: [facility]URO [facility]S;  Service: Neurosurgery;  Laterality: N/A;  Thoracic nine Kyphoplasty   ROTATOR CUFF REPAIR Right 1997   TOTAL SHOULDER ARTHROPLASTY Left 2017    Family History  Problem Relation Age of Onset   Cancer - Cervical Mother    Stroke Mother    Stroke Father    Esophageal cancer Maternal Uncle    Colon polyps Neg Hx    Colon cancer  Neg Hx    Rectal cancer Neg Hx    Stomach cancer Neg Hx    Social History:  reports that she quit smoking about 39 years ago. Her smoking use included cigarettes. She has never used smokeless tobacco. She reports that she does not drink alcohol and does not use drugs.  Allergies:  Allergies  Allergen Reactions   Codeine Other (See Comments)    Rapid heart rate (patient states she can now tolerate)    Penicillins Shortness Of Breath and Other (See Comments)    Rapid heart rate   Prolia [Denosumab] Anaphylaxis   Zetia [Ezetimibe]     Muscle pain    Medications Prior to Admission  Medication Sig Dispense Refill   chlorthalidone (HYGROTON) 25 MG tablet Take 25 mg by mouth in the morning.     dexlansoprazole (DEXILANT) 60 MG capsule Take 1 capsule (60 mg total) by mouth daily before breakfast. 30 capsule 11   famotidine (PEPCID) 40 MG tablet TAKE 1 TABLET BY MOUTH EVERY DAY 90 tablet 1   fenofibrate micronized (LOFIBRA) 134 MG capsule Take 134 mg by mouth at bedtime.   3   losartan (COZAAR) 100 MG tablet Take 100 mg by mouth at bedtime.     metoprolol succinate (TOPROL-XL) 100 MG 24 hr tablet Take 100 mg by mouth 2 (two) times daily. Take with or immediately following a meal.     ondansetron (ZOFRAN-ODT) 4 MG disintegrating tablet DISSOLVE 1 TABLET BY MOUTH EVERY 8 HOURS AS NEEDED FOR NAUSEA AND VOMITING 20 tablet 0   sertraline (ZOLOFT) 50 MG tablet Take 50 mg by mouth in the morning.     traMADol (ULTRAM) 50 MG tablet Take 50 mg by mouth every 6 (six) hours as needed (pain.).     Vitamin D, Ergocalciferol, (DRISDOL) 1.25 MG (50000 UNIT) CAPS capsule Take 50,000 Units by mouth every Friday.     Coenzyme Q10 (COQ-10) 100 MG CAPS Take 100 mg by mouth daily.      inclisiran (LEQVIO) 284 MG/1.5ML SOSY injection Inject 284 mg into the skin every 6 (six) months.      No results found for this or any previous visit (from the past 48 hours). No results found.  ROS All pertinent positives and negatives are listed HPI above  Blood pressure 108/81, pulse 60, temperature 97.8 F (36.6 C), temperature source Oral, resp. rate 18, height 5\' 1"  (1.549 m), weight 59 kg, SpO2 95%. Physical Exam  Awake alert oriented x 3, mild distress due to pain PERRLA Nonlabored breathing Moves all extremities equally Speech fluent and appropriate Sensory intact to light touch  throughout   Assessment/Plan 68 year old female with  L2 compression fracture  -Or today for kyphoplasty at L2.  She failed conservative measures and her pain became intractable and therefore we offered her surgical intervention.  Informed consent was obtained and witnessed.  We discussed all risks, benefits and expected outcomes as well as alternatives to treatment.  Thank you for allowing me to participate in this patient's care.  Please do not hesitate to call with questions or concerns.   Monia Pouch, DO Neurosurgeon [facility] [phone]

## 2023-03-27 ENCOUNTER — Encounter (HOSPITAL_COMMUNITY): Payer: Self-pay | Admitting: Neurological Surgery

## 2023-03-27 NOTE — Anesthesia Postprocedure Evaluation (Signed)
 Anesthesia Post Note  Patient: Breanna Barr  Procedure(s) Performed: KYPHOPLASTY L2 (Back)     Patient location during evaluation: PACU Anesthesia Type: General Level of consciousness: awake and alert Pain management: pain level controlled Vital Signs Assessment: post-procedure vital signs reviewed and stable Respiratory status: spontaneous breathing, nonlabored ventilation, respiratory function stable and patient connected to nasal cannula oxygen Cardiovascular status: blood pressure returned to baseline and stable Postop Assessment: no apparent nausea or vomiting Anesthetic complications: no   No notable events documented.  Last Vitals:  Vitals:   03/26/23 1330 03/26/23 1345  BP: (!) 156/60 108/73  Pulse: 78 74  Resp: 15 12  Temp:  36.5 C  SpO2: 93% 94%    Last Pain:  Vitals:   03/26/23 1330  TempSrc:   PainSc: 0-No pain                 Kennieth Rad

## 2023-06-18 ENCOUNTER — Other Ambulatory Visit: Payer: Self-pay | Admitting: Internal Medicine

## 2023-06-18 NOTE — Telephone Encounter (Signed)
 Last seen 03/2022, may I refill Sir.

## 2023-06-21 ENCOUNTER — Encounter: Payer: Self-pay | Admitting: Internal Medicine

## 2023-07-04 ENCOUNTER — Other Ambulatory Visit: Payer: Self-pay | Admitting: Internal Medicine

## 2023-07-04 ENCOUNTER — Telehealth: Payer: Self-pay

## 2023-07-04 DIAGNOSIS — M810 Age-related osteoporosis without current pathological fracture: Secondary | ICD-10-CM | POA: Insufficient documentation

## 2023-07-04 NOTE — Telephone Encounter (Signed)
 Auth Submission: NO AUTH NEEDED Site of care: Site of care: [facility] Payer: [employer] Medication & CPT/J Code(s) submitted: Reclast (Zolendronic acid) [id number] Diagnosis Code:  Route of submission (phone, fax, portal): portal Phone # Fax # Auth type: Buy/Bill PB Units/visits requested: 5mg  , 1dose Reference number: [id number] Approval from: 07/04/23 to 01/15/24

## 2023-07-17 ENCOUNTER — Encounter: Payer: Self-pay | Admitting: Internal Medicine

## 2023-07-22 ENCOUNTER — Encounter: Payer: Self-pay | Admitting: Internal Medicine

## 2023-07-22 ENCOUNTER — Ambulatory Visit (AMBULATORY_SURGERY_CENTER)

## 2023-07-22 VITALS — Ht 61.5 in | Wt 130.0 lb

## 2023-07-22 DIAGNOSIS — Z860100 Personal history of colon polyps, unspecified: Secondary | ICD-10-CM

## 2023-07-22 MED ORDER — NA SULFATE-K SULFATE-MG SULF 17.5-3.13-1.6 GM/177ML PO SOLN: 1.0000 | Freq: Once | ORAL | 0 refills | Status: AC

## 2023-07-22 NOTE — Progress Notes (Signed)
 No egg or soy allergy known to patient  No issues known to pt with past sedation with any surgeries or procedures Patient denies ever being told they had issues or difficulty with intubation  No FH of Malignant Hyperthermia Pt is not on diet pills Pt is not on  home 02  Pt is not on blood thinners  Constipation: Yes  No A fib or A flutter Have any cardiac testing pending--no  LOA: independent  Prep: suprep   Patient's chart reviewed by Norleen Schillings CNRA prior to previsit and patient appropriate for the [facility].  Previsit completed and red dot placed by patient's name on their procedure day (on provider's schedule).     PV completed with patient. Prep instructions sent via mychart and home address.

## 2023-07-30 ENCOUNTER — Ambulatory Visit

## 2023-07-30 MED ORDER — ACETAMINOPHEN 325 MG PO TABS: 650.0000 mg | ORAL_TABLET | Freq: Once | ORAL | Status: DC

## 2023-07-30 MED ORDER — ZOLEDRONIC ACID 5 MG/100ML IV SOLN: 5.0000 mg | Freq: Once | INTRAVENOUS | Status: DC

## 2023-07-30 MED ORDER — DIPHENHYDRAMINE HCL 25 MG PO CAPS: 25.0000 mg | ORAL_CAPSULE | Freq: Once | ORAL | Status: DC

## 2023-08-01 ENCOUNTER — Other Ambulatory Visit: Payer: Self-pay | Admitting: Internal Medicine

## 2023-08-06 ENCOUNTER — Telehealth: Payer: Self-pay | Admitting: Internal Medicine

## 2023-08-06 NOTE — Telephone Encounter (Signed)
 Called patient back and instructed her to increase her fluids today after her increased intake of corn last night.

## 2023-08-06 NOTE — Telephone Encounter (Signed)
 Inbound call from patient, states she had approximately two servings of corn last night, she is scheduled for colonoscopy tomorrow at 10:30 AM. Patient would like to know how to proceed with colonoscopy.

## 2023-08-07 ENCOUNTER — Ambulatory Visit: Admitting: Internal Medicine

## 2023-08-07 ENCOUNTER — Encounter: Payer: Self-pay | Admitting: Internal Medicine

## 2023-08-07 VITALS — BP 125/70 | HR 102 | Temp 97.4°F | Resp 26 | Ht 61.0 in | Wt 130.0 lb

## 2023-08-07 DIAGNOSIS — Z860101 Personal history of adenomatous and serrated colon polyps: Secondary | ICD-10-CM | POA: Diagnosis not present

## 2023-08-07 DIAGNOSIS — Z1211 Encounter for screening for malignant neoplasm of colon: Secondary | ICD-10-CM

## 2023-08-07 DIAGNOSIS — K648 Other hemorrhoids: Secondary | ICD-10-CM

## 2023-08-07 DIAGNOSIS — K635 Polyp of colon: Secondary | ICD-10-CM | POA: Diagnosis not present

## 2023-08-07 DIAGNOSIS — K562 Volvulus: Secondary | ICD-10-CM

## 2023-08-07 DIAGNOSIS — K644 Residual hemorrhoidal skin tags: Secondary | ICD-10-CM

## 2023-08-07 DIAGNOSIS — D123 Benign neoplasm of transverse colon: Secondary | ICD-10-CM

## 2023-08-07 DIAGNOSIS — D125 Benign neoplasm of sigmoid colon: Secondary | ICD-10-CM

## 2023-08-07 DIAGNOSIS — Z860100 Personal history of colon polyps, unspecified: Secondary | ICD-10-CM

## 2023-08-07 MED ORDER — SODIUM CHLORIDE 0.9 % IV SOLN: 500.0000 mL | INTRAVENOUS | Status: DC

## 2023-08-07 MED ORDER — ONDANSETRON 4 MG PO TBDP: 4.0000 mg | ORAL_TABLET | Freq: Three times a day (TID) | ORAL | 1 refills | Status: DC | PRN

## 2023-08-07 NOTE — Patient Instructions (Addendum)
 I found and removed two polyps - one ws large and the other small.  Both look benign. I will let you know pathology results and when to have another routine colonoscopy by mail and/or My Chart.\  Hemorrhoids were seen and responsible for the blood you see sometines.  I appreciate the opportunity to care for you. Lupita CHARLENA [name], MD, FACG  YOU HAD AN ENDOSCOPIC PROCEDURE TODAY AT THE [facility]:   Refer to the procedure report that was given to you for any specific questions about what was found during the examination.  If the procedure report does not answer your questions, please call your gastroenterologist to clarify.  If you requested that your care partner not be given the details of your procedure findings, then the procedure report has been included in a sealed envelope for you to review at your convenience later.  YOU SHOULD EXPECT: Some feelings of bloating in the abdomen. Passage of more gas than usual.  Walking can help get rid of the air that was put into your GI tract during the procedure and reduce the bloating. If you had a lower endoscopy (such as a colonoscopy or flexible sigmoidoscopy) you may notice spotting of blood in your stool or on the toilet paper. If you underwent a bowel prep for your procedure, you may not have a normal bowel movement for a few days.  Please Note:  You might notice some irritation and congestion in your nose or some drainage.  This is from the oxygen used during your procedure.  There is no need for concern and it should clear up in a day or so.  SYMPTOMS TO REPORT IMMEDIATELY:  Following lower endoscopy (colonoscopy or flexible sigmoidoscopy):  Excessive amounts of blood in the stool  Significant tenderness or worsening of abdominal pains  Swelling of the abdomen that is new, acute  Fever of 100F or higher  For urgent or emergent issues, a gastroenterologist can be reached at any hour by calling [phone]. Do not use MyChart  messaging for urgent concerns.    DIET:  We do recommend a small meal at first, but then you may proceed to your regular diet.  Drink plenty of fluids but you should avoid alcoholic beverages for 24 hours.  ACTIVITY:  You should plan to take it easy for the rest of today and you should NOT DRIVE or use heavy machinery until tomorrow (because of the sedation medicines used during the test).    FOLLOW UP: Our staff will call the number listed on your records the next business day following your procedure.  We will call around 7:15- 8:00 am to check on you and address any questions or concerns that you may have regarding the information given to you following your procedure. If we do not reach you, we will leave a message.     If any biopsies were taken you will be contacted by phone or by letter within the next 1-3 weeks.  Please call us  at [phone] if you have not heard about the biopsies in 3 weeks.    SIGNATURES/CONFIDENTIALITY: You and/or your care partner have signed paperwork which will be entered into your electronic medical record.  These signatures attest to the fact that that the information above on your After Visit Summary has been reviewed and is understood.  Full responsibility of the confidentiality of this discharge information lies with you and/or your care-partner.

## 2023-08-07 NOTE — Progress Notes (Signed)
 Sedate, gd SR, tolerated procedure well, VSS, report to RN

## 2023-08-07 NOTE — Progress Notes (Signed)
 [facility] History and Physical   Primary Care Physician:  Elliott, Dianne E   Reason for Procedure:    Encounter Diagnosis  Name Primary?   Hx of colonic polyps Yes     Plan:    colonoscopy     HPI: Breanna Barr is a 68 y.o. female here for surveillance colonoscopy exam  5 mm adenoma removed 2020   Past Medical History:  Diagnosis Date   Allergy    Barrett's esophagus    Cancer (HCC)    cerival ca   Chronic headaches    Clostridium difficile infection    Colitis    Compression fracture of thoracic vertebra (HCC)    T9   Esophageal stricture    Fibromyalgia    GERD (gastroesophageal reflux disease)    H/O hiatal hernia    Hx of adenomatous polyp of colon 07/22/2018   Hx of Clostridium difficile infection 07/07/2018   Hypertension    Osteoarthritis    Osteoporosis    Palpitations    Renal artery stenosis (HCC)    Rheumatoid arthritis (HCC)    Vitamin D deficiency     Past Surgical History:  Procedure Laterality Date   ABDOMINAL HYSTERECTOMY  1989   BALLOON DILATION N/A 06/16/2019   Procedure: BALLOON DILATION;  Surgeon: San Sandor GAILS, DO;  Location: [facility];  Service: Gastroenterology;  Laterality: N/A;  will need triamcinolone  injection   BIOPSY  06/16/2019   Procedure: BIOPSY;  Surgeon: San Sandor GAILS, DO;  Location: [facility];  Service: Gastroenterology;;   BIOPSY  03/29/2022   Procedure: BIOPSY;  Surgeon: Avram Lupita BRAVO, MD;  Location: [facility] ENDOSCOPY;  Service: Gastroenterology;;   CARDIAC CATHETERIZATION  2013   06/07/11 [facility]) mild non-obstructive CAD, normal LV wall motion and function, EF 60-65%, normal PA pressure, mild PVD of the right iliac and right common femoral artery.   COLONOSCOPY  Last, 2010   ESOPHAGEAL MANOMETRY N/A 05/27/2019   Procedure: ESOPHAGEAL MANOMETRY (EM);  Surgeon: Avram Lupita BRAVO, MD;  Location: [facility]OS[facility];  Service: Endoscopy;  Laterality: N/A;   ESOPHAGOGASTRODUODENOSCOPY  Multiple   With  esophageal dilation   ESOPHAGOGASTRODUODENOSCOPY (EGD) WITH PROPOFOL  N/A 06/16/2019   Procedure: ESOPHAGOGASTRODUODENOSCOPY (EGD) WITH PROPOFOL ;  Surgeon: San Sandor GAILS, DO;  Location: [facility];  Service: Gastroenterology;  Laterality: N/A;  Will need Triamcinolone  injections   ESOPHAGOGASTRODUODENOSCOPY (EGD) WITH PROPOFOL  N/A 03/29/2022   Procedure: ESOPHAGOGASTRODUODENOSCOPY (EGD) WITH PROPOFOL ;  Surgeon: Avram Lupita BRAVO, MD;  Location: [facility] ENDOSCOPY;  Service: Gastroenterology;  Laterality: N/A;  with dilation   KENALOG  INJECTION  06/16/2019   Procedure: KENALOG  INJECTION;  Surgeon: San Sandor GAILS, DO;  Location: [facility] ENDOSCOPY;  Service: Gastroenterology;;   KENALOG  INJECTION  03/29/2022   Procedure: KENALOG  INJECTION;  Surgeon: Avram Lupita BRAVO, MD;  Location: [facility] ENDOSCOPY;  Service: Gastroenterology;;   KYPHOPLASTY  02/12/2012   Procedure: KYPHOPLASTY;  Surgeon: Fairy Levels, MD;  Location: [facility] NEURO ORS;  Service: Neurosurgery;  Laterality: N/A;  Thoracic nine Kyphoplasty   KYPHOPLASTY N/A 03/26/2023   Procedure: KYPHOPLASTY L2;  Surgeon: Dawley, Lani BROCKS, DO;  Location: [facility] OR;  Service: Neurosurgery;  Laterality: N/A;  3C   ROTATOR CUFF REPAIR Right 1997   TOTAL SHOULDER ARTHROPLASTY Left 2017     Current Outpatient Medications  Medication Sig Dispense Refill   chlorthalidone (HYGROTON) 25 MG tablet Take 25 mg by mouth in the morning.     dexlansoprazole  (DEXILANT ) 60 MG capsule Take 1 capsule (60 mg total) by mouth daily before  breakfast. 30 capsule 11   famotidine  (PEPCID ) 40 MG tablet TAKE 1 TABLET BY MOUTH EVERY DAY 90 tablet 1   fenofibrate micronized (LOFIBRA) 134 MG capsule Take 134 mg by mouth at bedtime.   3   losartan (COZAAR) 100 MG tablet Take 100 mg by mouth at bedtime.     metoprolol  succinate (TOPROL -XL) 100 MG 24 hr tablet Take 100 mg by mouth 2 (two) times daily. Take with or immediately following a meal.     Vitamin D, Ergocalciferol, (DRISDOL) 1.25 MG (50000 UNIT)  CAPS capsule Take 50,000 Units by mouth every Friday.     Coenzyme Q10 (COQ-10) 100 MG CAPS Take 100 mg by mouth daily.      inclisiran (LEQVIO) 284 MG/1.5ML SOSY injection Inject 284 mg into the skin every 6 (six) months.     ondansetron  (ZOFRAN -ODT) 4 MG disintegrating tablet DISSOLVE 1 TABLET BY MOUTH EVERY 8 HOURS AS NEEDED FOR NAUSEA AND VOMITING (Patient taking differently: Take 4 mg by mouth as needed.) 20 tablet 0   sertraline (ZOLOFT) 50 MG tablet Take 50 mg by mouth in the morning. (Patient not taking: Reported on 07/22/2023)     traMADol  (ULTRAM ) 50 MG tablet Take 50 mg by mouth every 6 (six) hours as needed (pain.).     triamcinolone  cream (KENALOG ) 0.1 % Apply 1 Application topically as needed.     zoledronic  acid (RECLAST ) 5 MG/100ML SOLN injection Inject 5 mg into the vein. Not yet started (Patient taking differently: Inject 5 mg into the vein. Not yet started)     Current Facility-Administered Medications  Medication Dose Route Frequency Provider Last Rate Last Admin   0.9 %  sodium chloride  infusion  500 mL Intravenous Continuous Avram Lupita BRAVO, MD       Facility-Administered Medications Ordered in Other Visits  Medication Dose Route Frequency Provider Last Rate Last Admin   acetaminophen  (TYLENOL ) tablet 650 mg  650 mg Oral Once Faythe Purchase, MD       diphenhydrAMINE  (BENADRYL ) capsule 25 mg  25 mg Oral Once Faythe Purchase, MD       zoledronic  acid (RECLAST ) injection 5 mg  5 mg Intravenous Once Faythe Purchase, MD        Allergies as of 08/07/2023 - Review Complete 08/07/2023  Allergen Reaction Noted   Codeine Other (See Comments) and Itching 02/08/2012   Denosumab Anaphylaxis and Cough 02/05/2018   Penicillin g Shortness Of Breath 07/22/2023   Penicillins Shortness Of Breath and Other (See Comments) 02/08/2012   Statins Other (See Comments) 07/22/2023   Zetia [ezetimibe] Other (See Comments) 03/27/2022    Family History  Problem Relation Age of Onset   Cancer -  Cervical Mother    Stroke Mother    Stroke Father    Lung cancer Sister    Esophageal cancer Maternal Uncle    Colon polyps Neg Hx    Colon cancer Neg Hx    Rectal cancer Neg Hx    Stomach cancer Neg Hx     Social History   Socioeconomic History   Marital status: Married    Spouse name: Not on file   Number of children: 0   Years of education: Not on file   Highest education level: Not on file  Occupational History   Occupation: retired  Tobacco Use   Smoking status: Former    Current packs/day: 0.00    Types: Cigarettes    Quit date: 02/11/1984    Years since quitting: 39.5  Smokeless tobacco: Never  Vaping Use   Vaping status: Never Used  Substance and Sexual Activity   Alcohol use: No    Alcohol/week: 0.0 standard drinks of alcohol   Drug use: No   Sexual activity: Not on file  Other Topics Concern   Not on file  Social History Narrative   The patient is married. She has raised her great niece and great-nephew. One as a teenager the other is an adult. No biologic children. She is a retired Charity fundraiser.   2-4 caffeinated beverages daily   No alcohol no tobacco. Remote smoking.   Social Drivers of Corporate investment banker Strain: Not on file  Food Insecurity: Not on file  Transportation Needs: Not on file  Physical Activity: Not on file  Stress: Not on file  Social Connections: Not on file  Intimate Partner Violence: Not on file    Review of Systems:  All other review of systems negative except as mentioned in the HPI.  Physical Exam: Vital signs BP 131/67   Pulse 61   Temp (!) 97.4 F (36.3 C)   Ht 5' 1 (1.549 m)   Wt 130 lb (59 kg)   SpO2 99%   BMI 24.56 kg/m   General:   Alert,  Well-developed, well-nourished, pleasant and cooperative in NAD Lungs:  Clear throughout to auscultation.   Heart:  Regular rate and rhythm; no murmurs, clicks, rubs,  or gallops. Abdomen:  Soft, nontender and nondistended. Normal bowel sounds.    Neuro/Psych:  Alert and cooperative. Normal mood and affect. A and O x 3   @Bary Limbach  CHARLENA [name], MD, [facility] [phone] (pager) 08/07/2023 11:01 AM@

## 2023-08-07 NOTE — Progress Notes (Signed)
 Called to room to assist during endoscopic procedure.  Patient ID and intended procedure confirmed with present staff. Received instructions for my participation in the procedure from the performing physician.

## 2023-08-07 NOTE — Progress Notes (Signed)
 Pt's states no medical or surgical changes since previsit or office visit.

## 2023-08-07 NOTE — Op Note (Signed)
 [facility] Patient Name: Breanna Barr Procedure Date: 08/07/2023 11:39 AM MRN: [id number] Endoscopist: [name] [name] , MD, [id number] Age: 68 Referring MD:  Date of Birth: 1955-09-20 Gender: Female Account #: [id number] Procedure:                Colonoscopy Indications:              Surveillance: Personal history of adenomatous                            polyps on last colonoscopy 5 years ago, Last                            colonoscopy: 2020 Medicines:                Monitored Anesthesia Care Procedure:                Pre-Anesthesia Assessment:                           - Prior to the procedure, a History and Physical                            was performed, and patient medications and                            allergies were reviewed. The patient's tolerance of                            previous anesthesia was also reviewed. The risks                            and benefits of the procedure and the sedation                            options and risks were discussed with the patient.                            All questions were answered, and informed consent                            was obtained. Prior Anticoagulants: The patient has                            taken no anticoagulant or antiplatelet agents. ASA                            Grade Assessment: II - A patient with mild systemic                            disease. After reviewing the risks and benefits,                            the patient was deemed in satisfactory condition to  undergo the procedure.                           After obtaining informed consent, the colonoscope                            was passed under direct vision. Throughout the                            procedure, the patient's blood pressure, pulse, and                            oxygen saturations were monitored continuously. The                            CF [id number] #[id number] was introduced through the  anus                            and advanced to the the cecum, identified by                            appendiceal orifice and ileocecal valve. The                            colonoscopy was somewhat difficult due to                            significant looping. Successful completion of the                            procedure was aided by Abdominal binder. The                            patient tolerated the procedure well. The quality                            of the bowel preparation was good. The ileocecal                            valve, appendiceal orifice, and rectum were                            photographed. The bowel preparation used was SUPREP                            via split dose instruction. Scope In: 11:44:21 AM Scope Out: 12:15:46 PM Scope Withdrawal Time: 0 hours 29 minutes 19 seconds  Total Procedure Duration: 0 hours 31 minutes 25 seconds  Findings:                 A 30 mm polyp was found in the splenic flexure. The                            polyp was flat. The polyp was removed with a saline  injection-lift technique using a cold snare. The                            polyp was removed with a piecemeal technique using                            a cold snare. Resection and retrieval were                            complete. Verification of patient identification                            for the specimen was done. Estimated blood loss was                            minimal. Area was tattooed with an injection of 2                            mL of Spot (carbon black).                           A 6 mm polyp was found in the proximal sigmoid                            colon. The polyp was sessile. The polyp was removed                            with a cold snare. Resection and retrieval were                            complete. Verification of patient identification                            for the specimen was done. Estimated blood  loss was                            minimal.                           Internal hemorrhoids were found.                           The exam was otherwise without abnormality on                            direct and retroflexion views.                           Skin tags were found on perianal exam. Complications:            No immediate complications. Estimated Blood Loss:     Estimated blood loss was minimal. Impression:               - One 30 mm polyp at the splenic flexure, removed  using injection-lift and a cold snare and removed                            piecemeal using a cold snare. Resected and                            retrieved. Tattooed. (Endoscopic mucosal resection)                           - One 6 mm polyp in the proximal sigmoid colon,                            removed with a cold snare. Resected and retrieved.                           - Internal hemorrhoids.                           - The examination was otherwise normal on direct                            and retroflexion views.                           - Perianal skin tags found on perianal exam.                           - Personal history of colonic polyp - 5 mm adenoma                            2020. Recommendation:           - Patient has a contact number available for                            emergencies. The signs and symptoms of potential                            delayed complications were discussed with the                            patient. Return to normal activities tomorrow.                            Written discharge instructions were provided to the                            patient.                           - Resume previous diet.                           - Continue present medications.                           -  No aspirin, ibuprofen, naproxen, or other                            non-steroidal anti-inflammatory drugs for 2 weeks                             after polyp removal.                           - Repeat colonoscopy is recommended for                            surveillance. The colonoscopy date will be                            determined after pathology results from today's                            exam become available for review. Will need closer                            follow-up given size + piecemeal resection of the                            large polyp USE ABDOMINAL BINDER AGAIN [name] [name], MD 08/07/2023 12:26:56 PM This report has been signed electronically.

## 2023-08-08 ENCOUNTER — Telehealth: Payer: Self-pay

## 2023-08-08 ENCOUNTER — Encounter: Payer: Self-pay | Admitting: Internal Medicine

## 2023-08-08 NOTE — Telephone Encounter (Signed)
 Left message

## 2023-08-09 ENCOUNTER — Ambulatory Visit

## 2023-08-09 VITALS — BP 148/65 | HR 83 | Temp 97.9°F | Resp 14 | Ht 61.5 in | Wt 129.8 lb

## 2023-08-09 DIAGNOSIS — M810 Age-related osteoporosis without current pathological fracture: Secondary | ICD-10-CM

## 2023-08-09 LAB — SURGICAL PATHOLOGY

## 2023-08-09 MED ORDER — ACETAMINOPHEN 325 MG PO TABS
650.0000 mg | ORAL_TABLET | Freq: Once | ORAL | Status: AC
  Administered 2023-08-09: 650 mg via ORAL
  Filled 2023-08-09: qty 2

## 2023-08-09 MED ORDER — SODIUM CHLORIDE 0.9 % IV SOLN: INTRAVENOUS | Status: DC

## 2023-08-09 MED ORDER — ZOLEDRONIC ACID 5 MG/100ML IV SOLN
5.0000 mg | Freq: Once | INTRAVENOUS | Status: AC
Start: 2023-08-09 — End: 2023-08-09
  Administered 2023-08-09: 5 mg via INTRAVENOUS
  Filled 2023-08-09: qty 100

## 2023-08-09 MED ORDER — DIPHENHYDRAMINE HCL 25 MG PO CAPS
25.0000 mg | ORAL_CAPSULE | Freq: Once | ORAL | Status: AC
  Administered 2023-08-09: 25 mg via ORAL
  Filled 2023-08-09: qty 1

## 2023-08-09 NOTE — Progress Notes (Signed)
 Diagnosis: Osteoporosis  Provider:  Mannam, Praveen MD  Procedure: IV Infusion  IV Type: Peripheral, IV Location: R Forearm  Reclast  (Zolendronic Acid), Dose: 5 mg  Infusion Start Time: 1408  Infusion Stop Time: 1440  Post Infusion IV Care: Observation period completed and Peripheral IV Discontinued  Discharge: Condition: Stable, Destination: Home . AVS Provided  Performed by:  Rocky FORBES Sar, RN

## 2023-08-15 ENCOUNTER — Ambulatory Visit: Payer: Self-pay | Admitting: Internal Medicine

## 2023-08-15 DIAGNOSIS — Z860101 Personal history of adenomatous and serrated colon polyps: Secondary | ICD-10-CM

## 2023-10-03 ENCOUNTER — Other Ambulatory Visit (HOSPITAL_COMMUNITY): Payer: Self-pay

## 2023-10-03 ENCOUNTER — Telehealth: Payer: Self-pay

## 2023-10-03 NOTE — Telephone Encounter (Signed)
 Pharmacy Patient Advocate Encounter   Received notification from CoverMyMeds that prior authorization for Dexlansoprazole  60MG  dr capsules is required/requested.   Insurance verification completed.   The patient is insured through [employer] .   Per test claim: The current 30 day co-pay is, $120.87.  No PA needed at this time. This test claim was processed through [facility]- copay amounts may vary at other pharmacies due to pharmacy/plan contracts, or as the patient moves through the different stages of their insurance plan.

## 2023-10-30 ENCOUNTER — Other Ambulatory Visit: Payer: Self-pay | Admitting: Internal Medicine

## 2023-11-08 ENCOUNTER — Ambulatory Visit: Admitting: Gastroenterology

## 2023-12-16 ENCOUNTER — Other Ambulatory Visit: Payer: Self-pay | Admitting: Internal Medicine

## 2023-12-25 ENCOUNTER — Ambulatory Visit: Admitting: Gastroenterology

## 2023-12-25 ENCOUNTER — Encounter: Payer: Self-pay | Admitting: Gastroenterology

## 2023-12-25 VITALS — BP 180/80 | Ht 61.25 in | Wt 131.4 lb

## 2023-12-25 DIAGNOSIS — Z7901 Long term (current) use of anticoagulants: Secondary | ICD-10-CM | POA: Diagnosis not present

## 2023-12-25 DIAGNOSIS — Z860101 Personal history of adenomatous and serrated colon polyps: Secondary | ICD-10-CM

## 2023-12-25 DIAGNOSIS — R1319 Other dysphagia: Secondary | ICD-10-CM

## 2023-12-25 DIAGNOSIS — Z8679 Personal history of other diseases of the circulatory system: Secondary | ICD-10-CM | POA: Diagnosis not present

## 2023-12-25 DIAGNOSIS — R0602 Shortness of breath: Secondary | ICD-10-CM

## 2023-12-25 DIAGNOSIS — I771 Stricture of artery: Secondary | ICD-10-CM | POA: Diagnosis not present

## 2023-12-25 DIAGNOSIS — K219 Gastro-esophageal reflux disease without esophagitis: Secondary | ICD-10-CM

## 2023-12-25 DIAGNOSIS — K5900 Constipation, unspecified: Secondary | ICD-10-CM

## 2023-12-25 NOTE — Patient Instructions (Signed)
 Thank you for trusting me with your gastrointestinal care!   Camie Furbish, PA-C  _______________________________________________________  If your blood pressure at your visit was 140/90 or greater, please contact your primary care physician to follow up on this.  _______________________________________________________  If you are age 68 or older, your body mass index should be between 23-30. Your Body mass index is 24.62 kg/m. If this is out of the aforementioned range listed, please consider follow up with your Primary Care Provider.  If you are age 63 or younger, your body mass index should be between 19-25. Your Body mass index is 24.62 kg/m. If this is out of the aformentioned range listed, please consider follow up with your Primary Care Provider.   ________________________________________________________  The [facility] providers would like to encourage you to use MYCHART to communicate with providers for non-urgent requests or questions.  Due to long hold times on the telephone, sending your provider a message by [facility] may be a faster and more efficient way to get a response.  Please allow 48 business hours for a response.  Please remember that this is for non-urgent requests.  _______________________________________________________  [facility] is using a team-based approach to care.  Your team is made up of your doctor and two to three APPS. Our APPS (Nurse Practitioners and Physician Assistants) work with your physician to ensure care continuity for you. They are fully qualified to address your health concerns and develop a treatment plan. They communicate directly with your gastroenterologist to care for you. Seeing the Advanced Practice Practitioners on your physician's team can help you by facilitating care more promptly, often allowing for earlier appointments, access to diagnostic testing, procedures, and other specialty referrals.

## 2023-12-25 NOTE — Progress Notes (Signed)
 Nickcole Bralley [id number] 20-Jan-1955   Chief Complaint: Discuss colonoscopy  Referring Provider: Viktoria Lawrnce BRAVO, FNP Primary GI MD: Dr. Avram  HPI: Keyly Baldonado is a 68 y.o. female with past medical history of Barrett's esophagus, GERD, hiatal hernia, esophageal stricture, adenomatous colon polyps, prior C. difficile infection, HTN, osteoporosis, hysterectomy who presents today to discuss repeat colonoscopy.  Patient is on aspirin and Plavix.  Last seen in office 03/16/2022 for esophageal dysphagia.  Has history of recurrent esophageal stricture problems, normal esophageal manometry in the past, previous esophageal dilations as well as triamcinolone  injection. She underwent EGD 03/29/2022 with dilation of an esophageal stenosis as well as injection of triamcinolone .  Advised to remain on Dexilant .  Last colonoscopy 08/07/2023 at which time of 30 mm SSP was removed as well as a small tubular adenoma.  Recommended recall in 4 months.  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   Echo done 09/2023 NORMAL LEFT VENTRICULAR SYSTOLIC FUNCTION WITH MILD LVH  ESTIMATED EF: 55%, CALC EF(3D): 56%  NORMAL LA PRESSURES WITH NORMAL DIASTOLIC FUNCTION  NORMAL RIGHT VENTRICULAR SYSTOLIC FUNCTION  VALVULAR REGURGITATION: MILD AR, MILD MR, No PR, MILD TR  NO VALVULAR STENOSIS    Discussed the use of AI scribe software for clinical note transcription with the patient, who gave verbal consent to proceed.  History of Present Illness Krishika Fichter is a 68 year old female who presents for scheduling a follow-up colonoscopy.  Colorectal polyp and colonoscopy follow-up - Colonoscopy in July revealed a very large polyp with precancerous changes. - Presenting for scheduling a follow-up colonoscopy.  Abdominal symptoms and bowel habits - Abdominal cramping resembling menstrual cramps, onset earlier this year, episodes last for days and resolve spontaneously. -  Frequent constipation managed with Colace as needed. - Small amount of blood in stool once last week, attributed to hemorrhoids.  Gastroesophageal reflux and dysphagia - Severe reflux and dysphagia managed with triamcinolone  injections, resulting in good symptom control. - Previously required esophageal dilations two to three times per year, but has not needed them since starting injections. - Occasional mild swallowing difficulties, no major episodes.  Vascular disease and post-procedure status - Underwent vascular procedure in September for stent placement in the innominate artery due to over 80% blockage (initially identified as over 70% blockage on CT scan a year prior). - Started on Plavix immediately post-surgery.  Blood pressure management - History of hypertension, currently experiencing episodes of low blood pressure. - Reduced metoprolol  dose from 200 mg twice daily to once daily due to weakness. - No headaches or significant symptoms related to blood pressure changes.  Dyspnea - Shortness of breath since vascular surgery, monitored by vascular team. - Recent echocardiogram was normal. - Scheduled for further evaluation at the end of the month.  Previous GI Procedures/Imaging   Colonoscopy 08/07/2023 - One 30 mm polyp at the splenic flexure, removed using injection- lift and a cold snare and removed piecemeal using a cold snare. Resected and retrieved. Tattooed. ( Endoscopic mucosal resection)  - One 6 mm polyp in the proximal sigmoid colon, removed with a cold snare. Resected and retrieved.  - Internal hemorrhoids.  - The examination was otherwise normal on direct and retroflexion views.  - Perianal skin tags found on perianal exam.  - Personal history of colonic polyp - 5 mm adenoma 2020. Path: 1. Surgical [P], colon, splenic flexure, polyp (1) :       - SESSILE SERRATED POLYP WITHOUT CYTOLOGIC DYSPLASIA.  2. Surgical [P], colon, sigmoid, polyp (1) :       -  TUBULAR ADENOMA.   EGD 03/29/2022 - Benign- appearing esophageal stenosis. Dilated to 20 mm + forceps disruption. Injected w. triamcinolone  10 mg x 4 quadrants.  - 5 cm hiatal hernia.  - Gastroesophageal flap valve classified as Hill Grade IV ( no fold, wide open lumen, hiatal hernia present) .  - Erythematous mucosa in the antrum. Biopsied.  - The examination was otherwise normal.  Past Medical History:  Diagnosis Date   Allergy    Barrett's esophagus    Cancer (HCC)    cerival ca   Chronic headaches    Clostridium difficile infection    Colitis    Compression fracture of thoracic vertebra (HCC)    T9   Esophageal stricture    Fibromyalgia    GERD (gastroesophageal reflux disease)    H/O hiatal hernia    Hx of adenomatous polyp of colon 07/22/2018   Hx of Clostridium difficile infection 07/07/2018   Hypertension    Osteoarthritis    Osteoporosis    Palpitations    Renal artery stenosis    Rheumatoid arthritis (HCC)    Vitamin D deficiency     Past Surgical History:  Procedure Laterality Date   ABDOMINAL HYSTERECTOMY  1989   BALLOON DILATION N/A 06/16/2019   Procedure: BALLOON DILATION;  Surgeon: San Sandor GAILS, DO;  Location: [facility] ENDOS[facility];  Service: Gastroenterology;  Laterality: N/A;  will need triamcinolone  injection   BIOPSY  06/16/2019   Procedure: BIOPSY;  Surgeon: San Sandor GAILS, DO;  Location: [facility];  Service: Gastroenterology;;   BIOPSY  03/29/2022   Procedure: BIOPSY;  Surgeon: Avram Lupita BRAVO, MD;  Location: [facility] ENDOSCOPY;  Service: Gastroenterology;;   BRACHIOCEPHALIC VEIN ANGIOPLASTY / STENTING     CARDIAC CATHETERIZATION  2013   06/07/11 [facility]) mild non-obstructive CAD, normal LV wall motion and function, EF 60-65%, normal PA pressure, mild PVD of the right iliac and right common femoral artery.   COLONOSCOPY  Last, 2010   ESOPHAGEAL MANOMETRY N/A 05/27/2019   Procedure: ESOPHAGEAL MANOMETRY (EM);  Surgeon: Avram Lupita BRAVO, MD;  Location:  [facility] ENDOSCOPY;  Service: Endoscopy;  Laterality: N/A;   ESOPHAGOGASTRODUODENOSCOPY  Multiple   With esophageal dilation   ESOPHAGOGASTRODUODENOSCOPY (EGD) WITH PROPOFOL  N/A 06/16/2019   Procedure: ESOPHAGOGASTRODUODENOSCOPY (EGD) WITH PROPOFOL ;  Surgeon: San Sandor GAILS, DO;  Location: [facility] ENDOSCOPY;  Service: Gastroenterology;  Laterality: N/A;  Will need Triamcinolone  injections   ESOPHAGOGASTRODUODENOSCOPY (EGD) WITH PROPOFOL  N/A 03/29/2022   Procedure: ESOPHAGOGASTRODUODENOSCOPY (EGD) WITH PROPOFOL ;  Surgeon: Avram Lupita BRAVO, MD;  Location: [facility] ENDOSCOPY;  Service: Gastroenterology;  Laterality: N/A;  with dilation   KENALOG  INJECTION  06/16/2019   Procedure: KENALOG  INJECTION;  Surgeon: San Sandor GAILS, DO;  Location: [facility] ENDOSCOPY;  Service: Gastroenterology;;   KENALOG  INJECTION  03/29/2022   Procedure: KENALOG  INJECTION;  Surgeon: Avram Lupita BRAVO, MD;  Location: [facility] ENDOSCOPY;  Service: Gastroenterology;;   KYPHOPLASTY  02/12/2012   Procedure: KYPHOPLASTY;  Surgeon: Fairy Levels, MD;  Location: [facility] NEURO ORS;  Service: Neurosurgery;  Laterality: N/A;  Thoracic nine Kyphoplasty   KYPHOPLASTY N/A 03/26/2023   Procedure: KYPHOPLASTY L2;  Surgeon: Dawley, Lani BROCKS, DO;  Location: [facility] OR;  Service: Neurosurgery;  Laterality: N/A;  3C   ROTATOR CUFF REPAIR Right 1997   TOTAL SHOULDER ARTHROPLASTY Left 2017    Current Outpatient Medications  Medication Sig Dispense Refill   chlorthalidone (HYGROTON) 25 MG tablet Take 25 mg by  mouth in the morning.     Coenzyme Q10 (COQ-10) 100 MG CAPS Take 100 mg by mouth daily.      dexlansoprazole  (DEXILANT ) 60 MG capsule TAKE 1 CAPSULE (60 MG TOTAL) BY MOUTH DAILY BEFORE BREAKFAST. 90 capsule 3   famotidine  (PEPCID ) 40 MG tablet TAKE 1 TABLET BY MOUTH EVERY DAY 90 tablet 1   fenofibrate micronized (LOFIBRA) 134 MG capsule Take 134 mg by mouth at bedtime.   3   inclisiran (LEQVIO) 284 MG/1.5ML SOSY injection Inject 284 mg into the skin every 6 (six) months.      losartan (COZAAR) 100 MG tablet Take 100 mg by mouth at bedtime.     metoprolol  succinate (TOPROL -XL) 100 MG 24 hr tablet Take 100 mg by mouth 2 (two) times daily. Take with or immediately following a meal.     ondansetron  (ZOFRAN -ODT) 4 MG disintegrating tablet TAKE 1 TABLET BY MOUTH EVERY 8 HOURS AS NEEDED FOR NAUSEA AND VOMITING 20 tablet 1   traMADol  (ULTRAM ) 50 MG tablet Take 50 mg by mouth every 6 (six) hours as needed (pain.).     triamcinolone  cream (KENALOG ) 0.1 % Apply 1 Application topically as needed.     Vitamin D, Ergocalciferol, (DRISDOL) 1.25 MG (50000 UNIT) CAPS capsule Take 50,000 Units by mouth every Friday.     zoledronic  acid (RECLAST ) 5 MG/100ML SOLN injection Inject 5 mg into the vein. Not yet started (Patient taking differently: Inject 5 mg into the vein. Not yet started)     sertraline (ZOLOFT) 50 MG tablet Take 50 mg by mouth in the morning. (Patient not taking: Reported on 07/22/2023)     No current facility-administered medications for this visit.    Allergies as of 12/25/2023 - Review Complete 12/25/2023  Allergen Reaction Noted   Codeine Other (See Comments) and Itching 02/08/2012   Denosumab Anaphylaxis and Cough 02/05/2018   Penicillin g Shortness Of Breath 07/22/2023   Penicillins Shortness Of Breath and Other (See Comments) 02/08/2012   Statins Other (See Comments) 07/22/2023   Zetia [ezetimibe] Other (See Comments) 03/27/2022    Family History  Problem Relation Age of Onset   Cancer - Cervical Mother    Stroke Mother    Stroke Father    Lung cancer Sister    Esophageal cancer Maternal Uncle    Colon polyps Neg Hx    Colon cancer Neg Hx    Rectal cancer Neg Hx    Stomach cancer Neg Hx     Social History   Tobacco Use   Smoking status: Former    Current packs/day: 0.00    Types: Cigarettes    Quit date: 02/11/1984    Years since quitting: 39.8   Smokeless tobacco: Never  Vaping Use   Vaping status: Never Used  Substance Use Topics    Alcohol use: No    Alcohol/week: 0.0 standard drinks of alcohol   Drug use: No     Review of Systems:    Constitutional: No fever, chills Cardiovascular: No chest pain Respiratory: Shortness of breath Gastrointestinal: See HPI and otherwise negative   Physical Exam:  Vital signs: BP (!) 188/82 (BP Location: Left Arm, Patient Position: Sitting, Cuff Size: Normal)   Ht 5' 1.25 (1.556 m) Comment: heigth measured without shoes  Wt 131 lb 6 oz (59.6 kg)   BMI 24.62 kg/m   Constitutional: Pleasant, well-appearing female in NAD, alert and cooperative Head:  Normocephalic and atraumatic.  Respiratory: Respirations even and unlabored. Lungs clear to auscultation bilaterally.  No wheezes, crackles, or rhonchi.  Cardiovascular:  Regular rate and rhythm. No murmurs. No peripheral edema. Gastrointestinal:  Soft, nondistended, nontender. No rebound or guarding. Normal bowel sounds. No appreciable masses or hepatomegaly. Rectal:  Not performed.  Neurologic:  Alert and oriented x4;  grossly normal neurologically.  Skin:   Dry and intact without significant lesions or rashes. Psychiatric: Oriented to person, place and time. Demonstrates good judgement and reason without abnormal affect or behaviors.   RELEVANT LABS AND IMAGING: CBC    Component Value Date/Time   WBC 6.3 03/21/2023 1030   RBC 5.27 (H) 03/21/2023 1030   HGB 15.1 (H) 03/21/2023 1030   HCT 45.6 03/21/2023 1030   PLT 413 (H) 03/21/2023 1030   MCV 86.5 03/21/2023 1030   MCH 28.7 03/21/2023 1030   MCHC 33.1 03/21/2023 1030   RDW 13.2 03/21/2023 1030   LYMPHSABS 1.9 04/08/2017 1143   MONOABS 0.4 04/08/2017 1143   EOSABS 0.2 04/08/2017 1143   BASOSABS 0.0 04/08/2017 1143    CMP     Component Value Date/Time   NA 134 (L) 03/21/2023 1030   K 3.7 03/21/2023 1030   CL 96 (L) 03/21/2023 1030   CO2 30 03/21/2023 1030   GLUCOSE 95 03/21/2023 1030   BUN 13 03/21/2023 1030   CREATININE 1.30 (H) 03/21/2023 1030   CALCIUM 10.0  03/21/2023 1030   PROT 6.6 04/08/2017 1143   ALBUMIN  3.8 04/08/2017 1143   AST 26 04/08/2017 1143   ALT 15 04/08/2017 1143   ALKPHOS 57 04/08/2017 1143   BILITOT 0.5 04/08/2017 1143   GFRNONAA 45 (L) 03/21/2023 1030   GFRAA 84 (L) 02/11/2012 1320     Assessment/Plan:   Assessment & Plan Colonic polyp, post-polypectomy surveillance Large precancerous polyp removed, no malignancy.  Recent innominate artery stenting and initiation of Plavix in September complicates having a colonoscopy.  Patient is very anxious to have repeat colonoscopy due to history of large polyp.  Discussed with her that we may not be able to schedule this until she has been on Plavix for at least 6 months.  Will discuss further with Dr. Avram.  She is also having shortness of breath since stent placement and is being further evaluated for this at the end of the month.  - Discuss further with Dr. Avram, will likely need to wait on repeat colonoscopy until 6 months post stent placement.  Esophageal stricture, post-injection therapy Managed with triamcinolone  injections. Significant improvement, reduced dilation need. Occasional swallowing difficulties but not as severe as previously.  Constipation Chronic constipation improved with Colace.  Hemorrhoids Occasional minor rectal bleeding likely due to hemorrhoids.  Hypertension Elevated blood pressure today. Recent metoprolol  adjustment due to weakness.  Not having any headaches, dizziness, chest pain, etc.  - Advised to contact PCP/cardiology regarding high blood pressure today.    Camie Furbish, PA-C [facility] 12/25/2023, 11:51 AM  Patient Care Team: Elliott, Dianne E as PCP - General (Nurse Practitioner)     [facility] Attending    I agree with the Advanced Practitioner's note, impression and recommendations with the following additions:  Patient is now on Plavix after innominate artery stenting.  She has a follow-up at [facility] on  December 30.  I reviewed this with her by phone.  We have more time to wait before scheduling a repeat colonoscopy.  Ideally within a year that we often do these at around 6 months from the last colonoscopy.  She is going to speak to her vascular surgeon  about this as she has this appointment coming up and we will sorted out.  She knows to contact me and she we will ask the surgeon to send me a note.  I do think she could have her follow-up colonoscopy in the [place] [facility].  Lupita CHARLENA [name], MD, NOLIA

## 2023-12-26 ENCOUNTER — Encounter: Payer: Self-pay | Admitting: Gastroenterology

## 2023-12-27 NOTE — Addendum Note (Signed)
 Addended by: AVRAM PITTS E on: 12/27/2023 01:11 PM   Modules accepted: Orders

## 2024-01-20 ENCOUNTER — Telehealth: Payer: Self-pay | Admitting: Gastroenterology

## 2024-01-20 NOTE — Telephone Encounter (Signed)
 Breanna Barr can you please setup colon and previsit with Dr Avram.

## 2024-01-20 NOTE — Telephone Encounter (Signed)
 ASA 81 mg for life per Vascular surgeon note. I cannot see the plan. Is it okay to schedule?

## 2024-01-20 NOTE — Telephone Encounter (Signed)
 Okay to schedule colonoscopy in the [facility].  Thank you.

## 2024-01-20 NOTE — Telephone Encounter (Signed)
 Patient called requesting to schedule colonoscopy. Patient has stopped Plavix on 12/30. Requesting to know if she is able to scheduled.  Please advise further on scheduling. Thank you.

## 2024-01-24 ENCOUNTER — Ambulatory Visit

## 2024-01-24 VITALS — Ht 61.5 in | Wt 128.0 lb

## 2024-01-24 DIAGNOSIS — Z860100 Personal history of colon polyps, unspecified: Secondary | ICD-10-CM

## 2024-01-24 MED ORDER — NA SULFATE-K SULFATE-MG SULF 17.5-3.13-1.6 GM/177ML PO SOLN: 1.0000 | Freq: Once | ORAL | 0 refills | Status: AC

## 2024-01-24 NOTE — Progress Notes (Signed)
 PCP MD at time of PV: Elliott, Dianne E  __________________________________________________________________________________________________________________________________________  No egg allergy known to patient  No soy allergy known to patient No issues known to pt with past sedation with any surgeries or procedures Patient denies ever being told they had issues or difficulty with intubation  No FH of Malignant Hyperthermia Pt is not on diet pills Pt is not on  home 02  Pt is not on blood thinners  No A fib or A flutter Have any cardiac testing pending-- no  LOA: independent  No Chew or Snuff tobacco __________________________________________________________________________________________________________________________________________  Constipation: yes taking OTC colace as needed  Prep: suprep  __________________________________________________________________________________________________________________________________________  PV completed with patient. Prep instructions reviewed and provided during apt. Rx sent to preferred pharmacy.  __________________________________________________________________________________________________________________________________________

## 2024-01-25 ENCOUNTER — Other Ambulatory Visit: Payer: Self-pay | Admitting: Internal Medicine

## 2024-01-27 ENCOUNTER — Encounter: Payer: Self-pay | Admitting: Internal Medicine

## 2024-02-03 NOTE — Progress Notes (Unsigned)
 [facility] History and Physical   Primary Care Physician:  Elliott, Dianne E   Reason for Procedure:   Prior large sessile serrated colon polyp   Plan:    colonoscopy   The patient was provided an opportunity to ask questions and all were answered. The patient agreed with the plan.   HPI: Breanna Barr is a 69 y.o. female with a history of last colonoscopy 08/07/2023 at which time of 30 mm SSP was removed as well as a small tubular adenoma. Recommended recall in 4 months. She had a vascular procedure and required clopidogrel in the interim, that has been discontinued. She ius here for close follow-up of this large sessile serrated polyp removed then.   Past Medical History:  Diagnosis Date   Allergy    Barrett's esophagus    Cancer (HCC)    cerival ca   Chronic headaches    Clostridium difficile infection    Colitis    Compression fracture of thoracic vertebra (HCC)    T9   Esophageal stricture    Fibromyalgia    GERD (gastroesophageal reflux disease)    H/O hiatal hernia    Hx of adenomatous polyp of colon 07/22/2018   Hx of Clostridium difficile infection 07/07/2018   Hypertension    Osteoarthritis    Osteoporosis    Palpitations    Renal artery stenosis    Rheumatoid arthritis (HCC)    Vitamin D deficiency     Past Surgical History:  Procedure Laterality Date   ABDOMINAL HYSTERECTOMY  1989   BALLOON DILATION N/A 06/16/2019   Procedure: BALLOON DILATION;  Surgeon: San Sandor GAILS, DO;  Location: [facility];  Service: Gastroenterology;  Laterality: N/A;  will need triamcinolone  injection   BIOPSY  06/16/2019   Procedure: BIOPSY;  Surgeon: San Sandor GAILS, DO;  Location: [facility];  Service: Gastroenterology;;   BIOPSY  03/29/2022   Procedure: BIOPSY;  Surgeon: Avram Lupita BRAVO, MD;  Location: [facility] ENDOSCOPY;  Service: Gastroenterology;;   BRACHIOCEPHALIC VEIN ANGIOPLASTY / STENTING     CARDIAC CATHETERIZATION  2013   06/07/11 [facility]) mild  non-obstructive CAD, normal LV wall motion and function, EF 60-65%, normal PA pressure, mild PVD of the right iliac and right common femoral artery.   COLONOSCOPY  Last, 2010   ESOPHAGEAL MANOMETRY N/A 05/27/2019   Procedure: ESOPHAGEAL MANOMETRY (EM);  Surgeon: Avram Lupita BRAVO, MD;  Location: [facility];  Service: Endoscopy;  Laterality: N/A;   ESOPHAGOGASTRODUODENOSCOPY  Multiple   With esophageal dilation   ESOPHAGOGASTRODUODENOSCOPY (EGD) WITH PROPOFOL  N/A 06/16/2019   Procedure: ESOPHAGOGASTRODUODENOSCOPY (EGD) WITH PROPOFOL ;  Surgeon: San Sandor GAILS, DO;  Location: [facility];  Service: Gastroenterology;  Laterality: N/A;  Will need Triamcinolone  injections   ESOPHAGOGASTRODUODENOSCOPY (EGD) WITH PROPOFOL  N/A 03/29/2022   Procedure: ESOPHAGOGASTRODUODENOSCOPY (EGD) WITH PROPOFOL ;  Surgeon: Avram Lupita BRAVO, MD;  Location: [facility] ENDOSCOPY;  Service: Gastroenterology;  Laterality: N/A;  with dilation   KENALOG  INJECTION  06/16/2019   Procedure: KENALOG  INJECTION;  Surgeon: San Sandor GAILS, DO;  Location: [facility];  Service: Gastroenterology;;   KENALOG  INJECTION  03/29/2022   Procedure: KENALOG  INJECTION;  Surgeon: Avram Lupita BRAVO, MD;  Location: [facility] ENDOSCOPY;  Service: Gastroenterology;;   KYPHOPLASTY  02/12/2012   Procedure: KYPHOPLASTY;  Surgeon: Fairy Levels, MD;  Location: [facility] NEURO ORS;  Service: Neurosurgery;  Laterality: N/A;  Thoracic nine Kyphoplasty   KYPHOPLASTY N/A 03/26/2023   Procedure: KYPHOPLASTY L2;  Surgeon: Dawley, Lani BROCKS, DO;  Location: [facility] OR;  Service: Neurosurgery;  Laterality: N/A;  3C   ROTATOR CUFF REPAIR Right 1997   TOTAL SHOULDER ARTHROPLASTY Left 2017     Current Outpatient Medications  Medication Sig Dispense Refill   aspirin EC 81 MG tablet Take 81 mg by mouth daily.     chlorthalidone (HYGROTON) 25 MG tablet Take 25 mg by mouth in the morning.     Coenzyme Q10 (COQ-10) 100 MG CAPS Take 100 mg by mouth daily.      dexlansoprazole  (DEXILANT ) 60  MG capsule TAKE 1 CAPSULE (60 MG TOTAL) BY MOUTH DAILY BEFORE BREAKFAST. 90 capsule 3   famotidine  (PEPCID ) 40 MG tablet TAKE 1 TABLET BY MOUTH EVERY DAY 90 tablet 1   fenofibrate micronized (LOFIBRA) 134 MG capsule Take 134 mg by mouth at bedtime.   3   losartan (COZAAR) 100 MG tablet Take 100 mg by mouth at bedtime.     metoprolol  succinate (TOPROL -XL) 100 MG 24 hr tablet Take 100 mg by mouth 2 (two) times daily. Take with or immediately following a meal.     ondansetron  (ZOFRAN -ODT) 4 MG disintegrating tablet TAKE 1 TABLET BY MOUTH EVERY 8 HOURS AS NEEDED FOR NAUSEA AND VOMITING 20 tablet 1   Vitamin D, Ergocalciferol, (DRISDOL) 1.25 MG (50000 UNIT) CAPS capsule Take 50,000 Units by mouth every Friday.     inclisiran (LEQVIO) 284 MG/1.5ML SOSY injection Inject 284 mg into the skin every 6 (six) months.     traMADol  (ULTRAM ) 50 MG tablet Take 50 mg by mouth every 6 (six) hours as needed (pain.).     triamcinolone  cream (KENALOG ) 0.1 % Apply 1 Application topically as needed.     zoledronic  acid (RECLAST ) 5 MG/100ML SOLN injection Inject 5 mg into the vein. Not yet started     Current Facility-Administered Medications  Medication Dose Route Frequency Provider Last Rate Last Admin   0.9 %  sodium chloride  infusion  500 mL Intravenous Once Avram Lupita BRAVO, MD        Allergies as of 02/04/2024 - Review Complete 02/04/2024  Allergen Reaction Noted   Codeine Other (See Comments) and Itching 02/08/2012   Denosumab Anaphylaxis and Cough 02/05/2018   Penicillin g Shortness Of Breath 07/22/2023   Penicillins Other (See Comments), Shortness Of Breath, and Hives 02/08/2012   Statins Other (See Comments) 07/22/2023   Zetia [ezetimibe] Other (See Comments) 03/27/2022    Family History  Problem Relation Age of Onset   Cancer - Cervical Mother    Stroke Mother    Stroke Father    Lung cancer Sister    Esophageal cancer Maternal Uncle    Colon polyps Neg Hx    Colon cancer Neg Hx    Rectal  cancer Neg Hx    Stomach cancer Neg Hx     Social History   Socioeconomic History   Marital status: Married    Spouse name: Not on file   Number of children: 0   Years of education: Not on file   Highest education level: Not on file  Occupational History   Occupation: retired  Tobacco Use   Smoking status: Former    Current packs/day: 0.00    Types: Cigarettes    Quit date: 02/11/1984    Years since quitting: 40.0   Smokeless tobacco: Never  Vaping Use   Vaping status: Never Used  Substance and Sexual Activity   Alcohol use: No    Alcohol/week: 0.0 standard drinks of alcohol   Drug use: No   Sexual activity: Not  on file  Other Topics Concern   Not on file  Social History Narrative   The patient is married. She has raised her great niece and great-nephew. One as a teenager the other is an adult. No biologic children. She is a retired charity fundraiser.   2-4 caffeinated beverages daily   No alcohol no tobacco. Remote smoking.   Social Drivers of Health   Tobacco Use: Medium Risk (01/24/2024)   Patient History    Smoking Tobacco Use: Former    Smokeless Tobacco Use: Never    Passive Exposure: Not on Actuary Strain: Not on file  Food Insecurity: Not on file  Transportation Needs: No Transportation Needs (10/03/2023)   Received from [facility]   PRAPARE - Transportation    In the past 12 months, has lack of transportation kept you from medical appointments or from getting medications?: No    Lack of Transportation (Non-Medical): No  Physical Activity: Not on file  Stress: Not on file  Social Connections: Not on file  Intimate Partner Violence: Not on file  Depression (EYV7-0): Not on file  Alcohol Screen: Not on file  Housing: Unknown (10/02/2023)   Received from [facility]   Epic    Unable to Pay for Housing in the Last Year: Not on file    Number of Times Moved in the Last Year: Not on file    At any  time in the past 12 months, were you homeless or living in a shelter (including now)?: No  Utilities: Not on file  Health Literacy: Not on file    Review of Systems:  All other review of systems negative except as mentioned in the HPI.  Physical Exam: Vital signs BP (!) 144/76   Pulse 66   Temp (!) 97.3 F (36.3 C)   Ht 5' 1 (1.549 m)   Wt 130 lb (59 kg)   SpO2 100%   BMI 24.56 kg/m   General:   Alert,  Well-developed, well-nourished, pleasant and cooperative in NAD Lungs:  Clear throughout to auscultation.   Heart:  Regular rate and rhythm; no murmurs, clicks, rubs,  or gallops. Abdomen:  Soft, nontender and nondistended. Normal bowel sounds.   Neuro/Psych:  Alert and cooperative. Normal mood and affect. A and O x 3   @Gilman Olazabal  CHARLENA [name], MD, [facility] [phone] (pager) 02/04/2024 10:18 AM@

## 2024-02-04 ENCOUNTER — Encounter: Payer: Self-pay | Admitting: Internal Medicine

## 2024-02-04 ENCOUNTER — Ambulatory Visit: Admitting: Internal Medicine

## 2024-02-04 VITALS — BP 122/64 | HR 93 | Temp 97.3°F | Resp 18 | Ht 61.0 in | Wt 130.0 lb

## 2024-02-04 DIAGNOSIS — K5730 Diverticulosis of large intestine without perforation or abscess without bleeding: Secondary | ICD-10-CM

## 2024-02-04 DIAGNOSIS — D125 Benign neoplasm of sigmoid colon: Secondary | ICD-10-CM | POA: Diagnosis not present

## 2024-02-04 DIAGNOSIS — K648 Other hemorrhoids: Secondary | ICD-10-CM

## 2024-02-04 DIAGNOSIS — D123 Benign neoplasm of transverse colon: Secondary | ICD-10-CM

## 2024-02-04 DIAGNOSIS — K635 Polyp of colon: Secondary | ICD-10-CM | POA: Diagnosis not present

## 2024-02-04 DIAGNOSIS — Z860101 Personal history of adenomatous and serrated colon polyps: Secondary | ICD-10-CM | POA: Diagnosis not present

## 2024-02-04 DIAGNOSIS — K644 Residual hemorrhoidal skin tags: Secondary | ICD-10-CM

## 2024-02-04 DIAGNOSIS — Z1211 Encounter for screening for malignant neoplasm of colon: Secondary | ICD-10-CM

## 2024-02-04 DIAGNOSIS — D126 Benign neoplasm of colon, unspecified: Secondary | ICD-10-CM

## 2024-02-04 MED ORDER — SODIUM CHLORIDE 0.9 % IV SOLN: 500.0000 mL | Freq: Once | INTRAVENOUS | Status: AC

## 2024-02-04 NOTE — Op Note (Signed)
 [facility] Patient Name: Breanna Barr Procedure Date: 02/04/2024 10:18 AM MRN: [id number] Endoscopist: [name] [name] , MD, [id number] Age: 69 Referring MD:  Date of Birth: 1955/04/01 Gender: Female Account #: [id number] Procedure:                Colonoscopy Indications:              High risk colon cancer surveillance: Personal                            history of colonic polyps, Last colonoscopy: July                            2025 - 30 mm ssp removed from splenic flexure by                            cold snare Medicines:                Monitored Anesthesia Care Procedure:                Pre-Anesthesia Assessment:                           - Prior to the procedure, a History and Physical                            was performed, and patient medications and                            allergies were reviewed. The patient's tolerance of                            previous anesthesia was also reviewed. The risks                            and benefits of the procedure and the sedation                            options and risks were discussed with the patient.                            All questions were answered, and informed consent                            was obtained. Prior Anticoagulants: The patient has                            taken no anticoagulant or antiplatelet agents. ASA                            Grade Assessment: II - A patient with mild systemic                            disease. After reviewing the risks and benefits,  the patient was deemed in satisfactory condition to                            undergo the procedure.                           After obtaining informed consent, the colonoscope                            was passed under direct vision. Throughout the                            procedure, the patient's blood pressure, pulse, and                            oxygen saturations were monitored continuously. The                             CF [id number] #[id number] was introduced through the anus                            and advanced to the the cecum, identified by                            appendiceal orifice and ileocecal valve. ABDOMINAL                            BINDER USED The colonoscopy was performed without                            difficulty. The patient tolerated the procedure                            well. The quality of the bowel preparation was                            good. The ileocecal valve, appendiceal orifice, and                            rectum were photographed. The bowel preparation                            used was SUPREP via split dose instruction. Scope In: 10:26:05 AM Scope Out: 10:43:25 AM Scope Withdrawal Time: 0 hours 14 minutes 44 seconds  Total Procedure Duration: 0 hours 17 minutes 20 seconds  Findings:                 Skin tags were found on perianal exam.                           An 8 mm polyp was found in the splenic flexure.                            Tattoo present - this is residual polyp  in scar                            frtom 30 mm polypectomy 07/2023 The polyp was flat.                            The polyp was removed with a hot snare. Resection                            and retrieval were complete. Verification of                            patient identification for the specimen was done.                            Estimated blood loss: none.                           Two sessile polyps were found in the sigmoid colon                            and transverse colon. The polyps were diminutive in                            size. These polyps were removed with a cold snare.                            Resection and retrieval were complete. Verification                            of patient identification for the specimen was                            done. Estimated blood loss was minimal.                           Multiple diverticula were found  in the sigmoid                            colon.                           Internal hemorrhoids were found.                           The exam was otherwise without abnormality on                            direct and retroflexion views. Complications:            No immediate complications. Estimated Blood Loss:     Estimated blood loss was minimal. Impression:               - Perianal skin tags found on perianal exam.                           -  One 8 mm polyp at the splenic flexure, removed                            with a hot snare. Resected and retrieved. Residual                            polyp from proior polypectomy 07/2023                           - Two diminutive polyps in the sigmoid colon and in                            the transverse colon, removed with a cold snare.                            Resected and retrieved.                           - Diverticulosis in the sigmoid colon.                           - Internal hemorrhoids.                           - The examination was otherwise normal on direct                            and retroflexion views.                           - Personal history of colonic polyps.202                            doiminutive adenoma, 7/25 30 mm ssp and 6 mm adenoma Recommendation:           - Patient has a contact number available for                            emergencies. The signs and symptoms of potential                            delayed complications were discussed with the                            patient. Return to normal activities tomorrow.                            Written discharge instructions were provided to the                            patient.                           - Resume previous diet.                           -  Continue present medications.                           - No ibuprofen, naproxen, or other non-steroidal                            anti-inflammatory drugs for 2 weeks. Continue 81 mg                             ASA due to inominate/brachiocephalic artery stent                           - Repeat colonoscopy is recommended for                            surveillance. The colonoscopy date will be                            determined after pathology results from today's                            exam become available for review. USE ABDOMINAL                            BINDER TO OVERCOME LOOPING AGAIN [name] [name], MD 02/04/2024 10:58:23 AM This report has been signed electronically.

## 2024-02-04 NOTE — Progress Notes (Signed)
 Called to room to assist during endoscopic procedure.  Patient ID and intended procedure confirmed with present staff. Received instructions for my participation in the procedure from the performing physician.

## 2024-02-04 NOTE — Progress Notes (Signed)
 Sedate, gd SR, tolerated procedure well, VSS, report to RN

## 2024-02-04 NOTE — Patient Instructions (Addendum)
 Was a small amount of residual polyp last that I removed and I found 2 other tiny polyps today.  I will also saw your diverticulosis.  I will let you know the results and recommendations.  If you are using anti-inflammatories like Advil, Aleve, ibuprofen or Goody's or BC's please do not do so for about 2 weeks.  I want you to continue your baby aspirin however.  I appreciate the opportunity to care for you. Lupita CHARLENA [name], MD, FACG   YOU HAD AN ENDOSCOPIC PROCEDURE TODAY AT THE [facility]:   Refer to the procedure report that was given to you for any specific questions about what was found during the examination.  If the procedure report does not answer your questions, please call your gastroenterologist to clarify.  If you requested that your care partner not be given the details of your procedure findings, then the procedure report has been included in a sealed envelope for you to review at your convenience later.  YOU SHOULD EXPECT: Some feelings of bloating in the abdomen. Passage of more gas than usual.  Walking can help get rid of the air that was put into your GI tract during the procedure and reduce the bloating. If you had a lower endoscopy (such as a colonoscopy or flexible sigmoidoscopy) you may notice spotting of blood in your stool or on the toilet paper. If you underwent a bowel prep for your procedure, you may not have a normal bowel movement for a few days.  Please Note:  You might notice some irritation and congestion in your nose or some drainage.  This is from the oxygen used during your procedure.  There is no need for concern and it should clear up in a day or so.  SYMPTOMS TO REPORT IMMEDIATELY:  Following lower endoscopy (colonoscopy or flexible sigmoidoscopy):  Excessive amounts of blood in the stool  Significant tenderness or worsening of abdominal pains  Swelling of the abdomen that is new, acute  Fever of 100F or higher  For urgent or emergent  issues, a gastroenterologist can be reached at any hour by calling [phone]. Do not use MyChart messaging for urgent concerns.    DIET:  We do recommend a small meal at first, but then you may proceed to your regular diet.  Drink plenty of fluids but you should avoid alcoholic beverages for 24 hours.  ACTIVITY:  You should plan to take it easy for the rest of today and you should NOT DRIVE or use heavy machinery until tomorrow (because of the sedation medicines used during the test).    FOLLOW UP: Our staff will call the number listed on your records the next business day following your procedure.  We will call around 7:15- 8:00 am to check on you and address any questions or concerns that you may have regarding the information given to you following your procedure. If we do not reach you, we will leave a message.     If any biopsies were taken you will be contacted by phone or by letter within the next 1-3 weeks.  Please call us  at [phone] if you have not heard about the biopsies in 3 weeks.    SIGNATURES/CONFIDENTIALITY: You and/or your care partner have signed paperwork which will be entered into your electronic medical record.  These signatures attest to the fact that that the information above on your After Visit Summary has been reviewed and is understood.  Full responsibility of the confidentiality  of this discharge information lies with you and/or your care-partner.

## 2024-02-04 NOTE — Progress Notes (Signed)
 Pt's states no medical or surgical changes since previsit or office visit.

## 2024-02-05 ENCOUNTER — Telehealth: Payer: Self-pay

## 2024-02-05 NOTE — Telephone Encounter (Signed)
" °  Follow up Call-     02/04/2024    9:09 AM 08/07/2023   10:29 AM  Call back number  Post procedure Call Back phone  # [phone]  Permission to leave phone message Yes Yes     Patient questions:  Do you have a fever, pain , or abdominal swelling? No. Pain Score  0 *  Have you tolerated food without any problems? Yes.    Have you been able to return to your normal activities? Yes.    Do you have any questions about your discharge instructions: Diet   No. Medications  No. Follow up visit  No.  Do you have questions or concerns about your Care? No.  Actions: * If pain score is 4 or above: No action needed, pain <4.   "

## 2024-02-06 LAB — SURGICAL PATHOLOGY

## 2024-02-18 ENCOUNTER — Ambulatory Visit: Payer: Self-pay | Admitting: Internal Medicine

## 2024-02-18 DIAGNOSIS — Z860101 Personal history of adenomatous and serrated colon polyps: Secondary | ICD-10-CM
